# Patient Record
Sex: Female | Born: 1980 | Race: White | Hispanic: No | Smoking: Never smoker
Health system: Southern US, Community
[De-identification: ages and names within clinical notes are randomized; demographics above are authoritative.]

## PROBLEM LIST (undated history)

## (undated) DIAGNOSIS — Z9889 Other specified postprocedural states: Secondary | ICD-10-CM

## (undated) DIAGNOSIS — K8020 Calculus of gallbladder without cholecystitis without obstruction: Secondary | ICD-10-CM

## (undated) DIAGNOSIS — K760 Fatty (change of) liver, not elsewhere classified: Secondary | ICD-10-CM

## (undated) DIAGNOSIS — E669 Obesity, unspecified: Secondary | ICD-10-CM

## (undated) DIAGNOSIS — E282 Polycystic ovarian syndrome: Secondary | ICD-10-CM

## (undated) DIAGNOSIS — K219 Gastro-esophageal reflux disease without esophagitis: Secondary | ICD-10-CM

## (undated) HISTORY — DX: Calculus of gallbladder without cholecystitis without obstruction: K80.20

## (undated) HISTORY — PX: APPENDECTOMY: SHX54

## (undated) HISTORY — PX: COLONOSCOPY: SHX174

## (undated) HISTORY — DX: Gastro-esophageal reflux disease without esophagitis: K21.9

## (undated) HISTORY — PX: KNEE SURGERY: SHX244

## (undated) HISTORY — DX: Obesity, unspecified: E66.9

## (undated) HISTORY — DX: Polycystic ovarian syndrome: E28.2

## (undated) HISTORY — DX: Other specified postprocedural states: Z98.890

## (undated) HISTORY — DX: Fatty (change of) liver, not elsewhere classified: K76.0

## (undated) HISTORY — PX: TONSILLECTOMY: SUR1361

---

## 2009-03-19 ENCOUNTER — Emergency Department (HOSPITAL_COMMUNITY): Admission: EM | Admit: 2009-03-19 | Discharge: 2009-03-19 | Payer: Self-pay | Admitting: Emergency Medicine

## 2010-02-27 ENCOUNTER — Emergency Department (HOSPITAL_COMMUNITY): Admission: EM | Admit: 2010-02-27 | Discharge: 2010-02-27 | Payer: Self-pay | Admitting: Family Medicine

## 2010-05-09 IMAGING — CT CT PELVIS W/ CM
2 of 4 series · 17 of 46 positions shown, 19 images · IV contrast (agent unspecified)
Comparison: None

CT ABDOMEN

CLINICAL DATA: Right side and periumbilical abdominal pain,
nausea, vomiting, diarrhea, negative pregnancy test, status post
appendectomy

CT ABDOMEN AND PELVIS WITH CONTRAST
TECHNIQUE: Multidetector CT imaging of the abdomen and pelvis was
performed using the standard protocol following bolus
administration of intravenous contrast. Breast shield utilized.
Sagittal and coronal MPR images reconstructed from axial data set.
Contrast: Water as oral contrast; 100 ml Cmnipaque-REE IV

[Series 2: abd_pel 5.0 b40f st · axial · 0.73mm/px · z∈[-538,-108]mm · 14 of 94 slices shown, 16 images]
[im 4/94  soft-tissue]
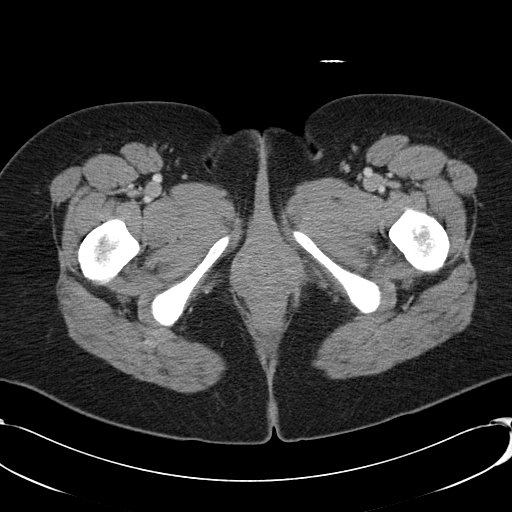
[im 4/94  bone]
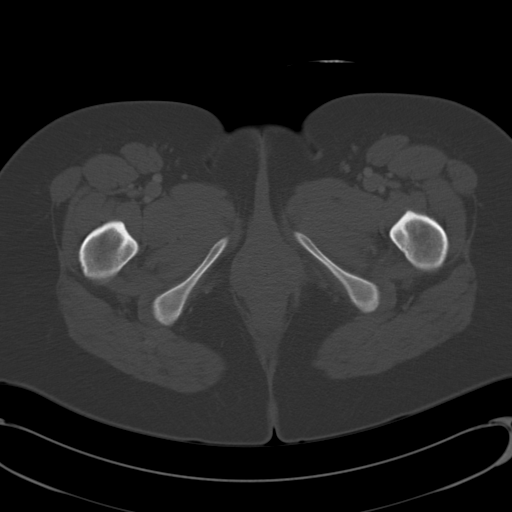
[im 11/94  soft-tissue]
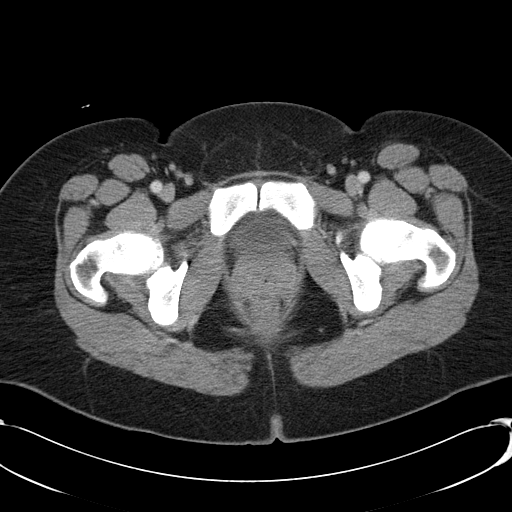
[im 18/94  soft-tissue]
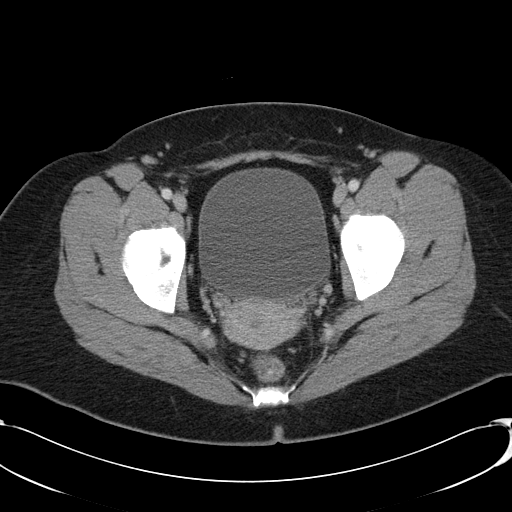
[im 25/94  soft-tissue]
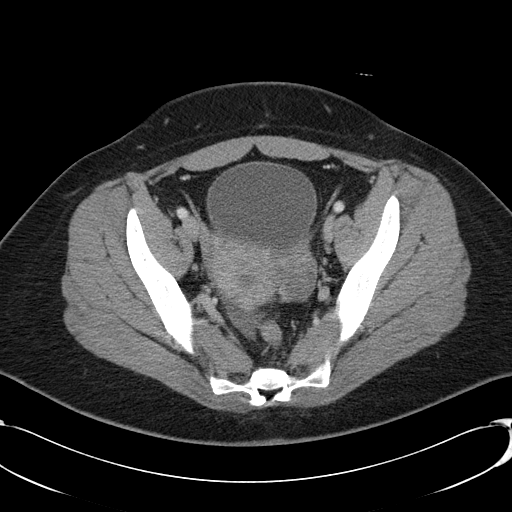
[im 32/94  soft-tissue]
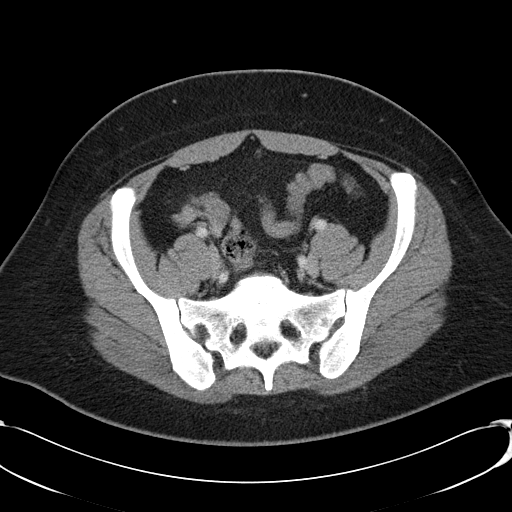
[im 38/94  soft-tissue]
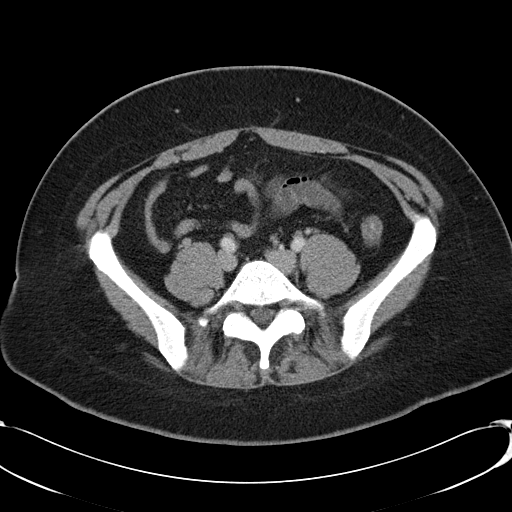
[im 45/94  soft-tissue]
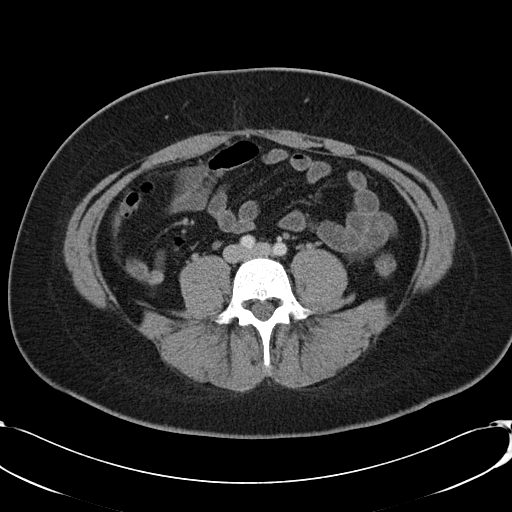
[im 49/94  soft-tissue]
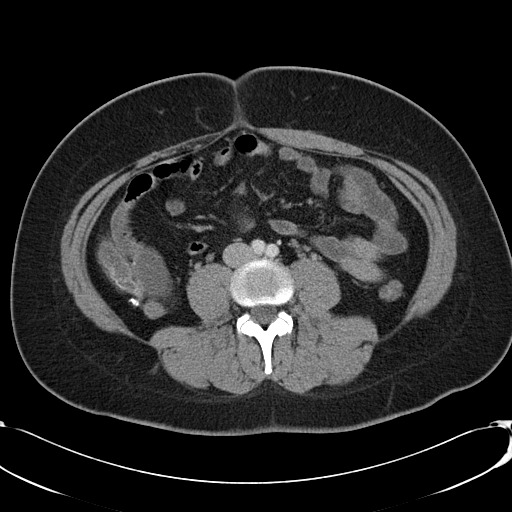
[im 56/94  soft-tissue]
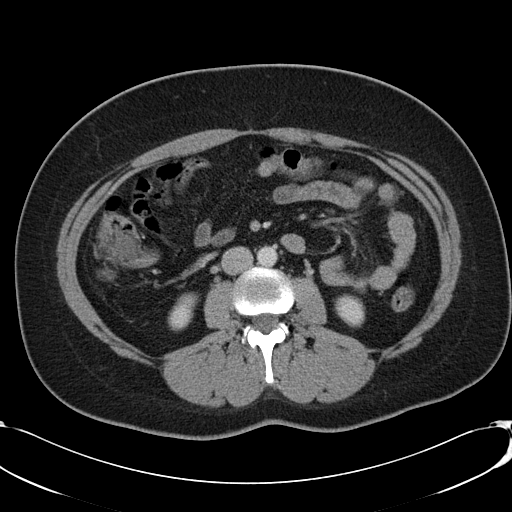
[im 56/94  bone]
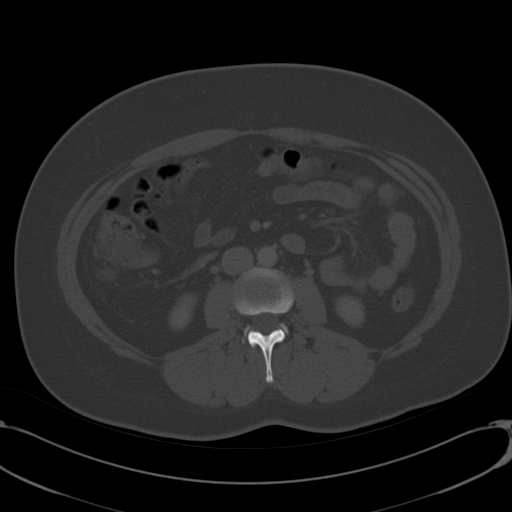
[im 63/94  soft-tissue]
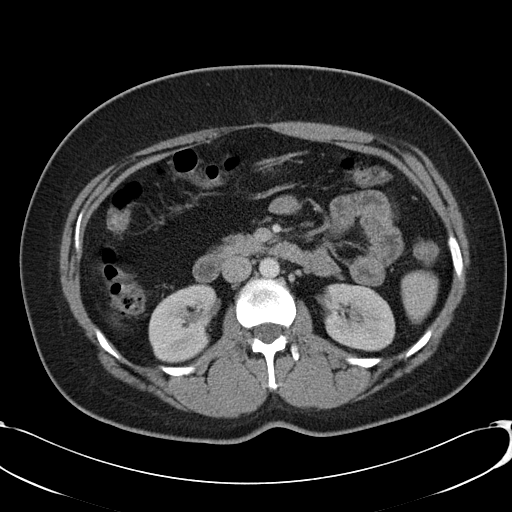
[im 69/94  soft-tissue]
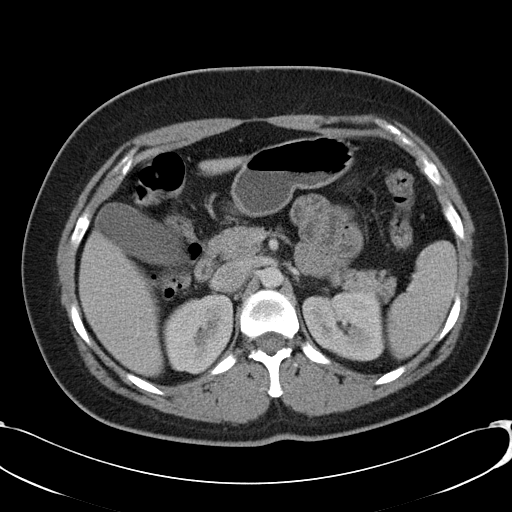
[im 76/94  soft-tissue]
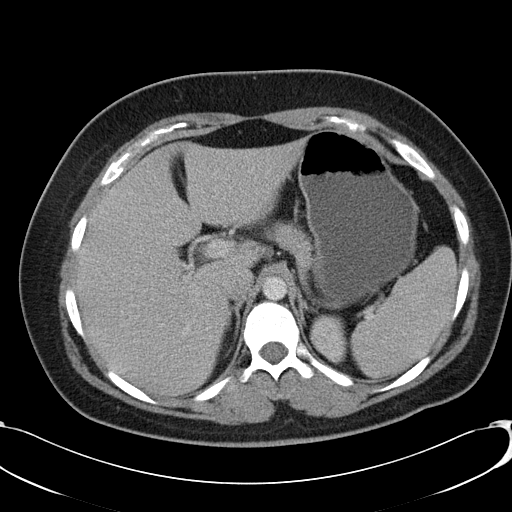
[im 83/94  soft-tissue]
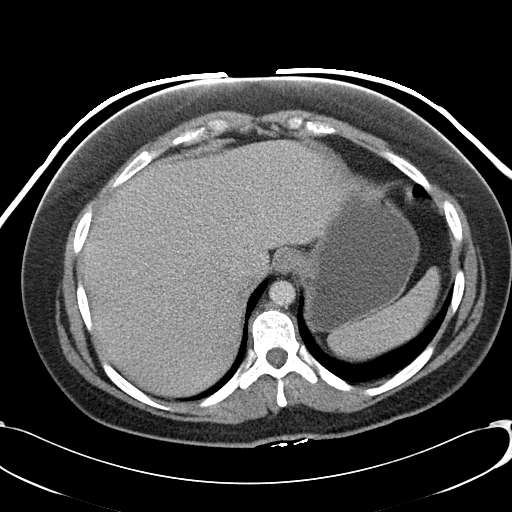
[im 90/94  soft-tissue]
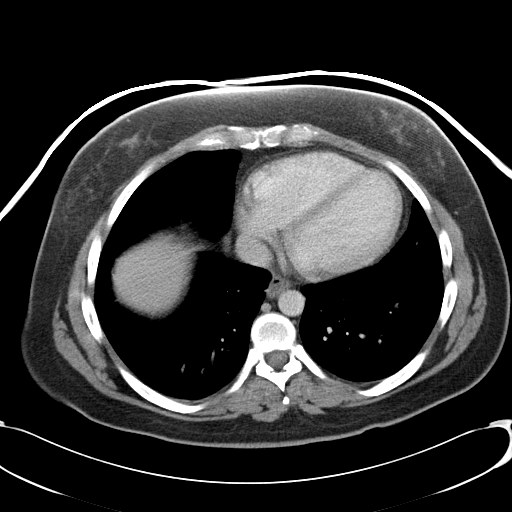

[Series 603: coronal · coronal · 0.95mm/px · 3 of 71 slices shown]
[im 24/71  soft-tissue]
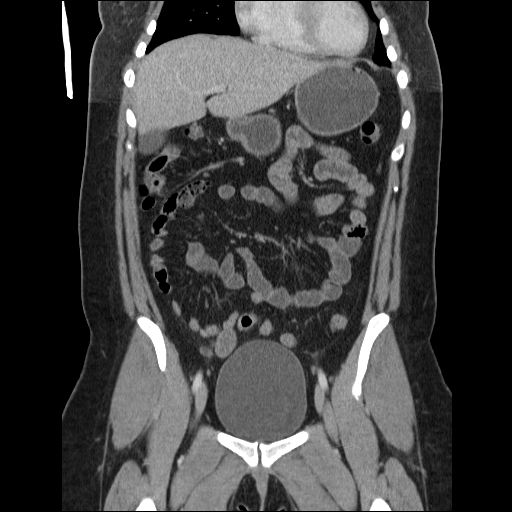
[im 32/71  soft-tissue]
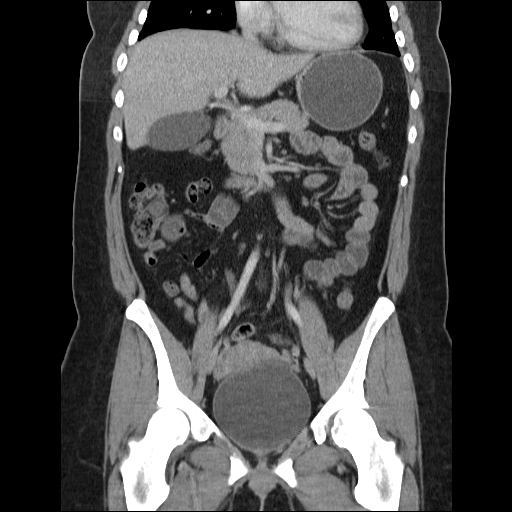
[im 39/71  soft-tissue]
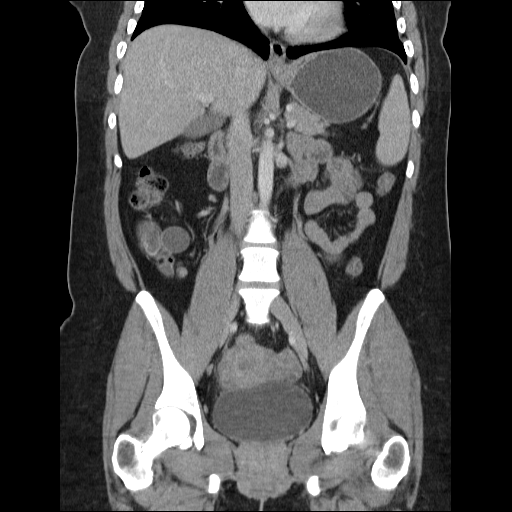

[17 of 46 positions shown; findings below may reference images not displayed]

FINDINGS: Lung bases clear.
Liver, spleen, pancreas, kidneys, and adrenal glands normal.
No upper abdominal mass, adenopathy, free fluid, or inflammatory
process.
Bones unremarkable.
Stomach and upper abdominal bowel loops grossly unremarkable for
exam lacking GI contrast.
IMPRESSION: No acute upper abdominal abnormalities.

CT PELVIS
FINDINGS: Small amount of cul-de-sac fluid.
Bladder and distal ureters normal.
Unremarkable appearance of uterus and ovaries.
Surgical clips present at cecal tip.
No pelvic mass, adenopathy, free fluid, or inflammatory process.
IMPRESSION: Small amount nonspecific free pelvic fluid.
Otherwise negative CT pelvis.

## 2010-08-09 ENCOUNTER — Emergency Department (HOSPITAL_COMMUNITY): Admission: EM | Admit: 2010-08-09 | Discharge: 2010-08-10 | Payer: Self-pay | Admitting: Emergency Medicine

## 2010-08-09 ENCOUNTER — Emergency Department (HOSPITAL_COMMUNITY): Admission: EM | Admit: 2010-08-09 | Discharge: 2010-08-09 | Payer: Self-pay | Admitting: Family Medicine

## 2010-08-22 ENCOUNTER — Ambulatory Visit (HOSPITAL_COMMUNITY): Admission: RE | Admit: 2010-08-22 | Discharge: 2010-08-22 | Payer: Self-pay | Admitting: Family Medicine

## 2010-11-17 ENCOUNTER — Encounter: Payer: Self-pay | Admitting: Family Medicine

## 2010-11-18 ENCOUNTER — Encounter: Payer: Self-pay | Admitting: Family Medicine

## 2011-01-10 LAB — URINALYSIS, ROUTINE W REFLEX MICROSCOPIC
Bilirubin Urine: NEGATIVE
Glucose, UA: NEGATIVE mg/dL
Hgb urine dipstick: NEGATIVE
Specific Gravity, Urine: 1.033 — ABNORMAL HIGH (ref 1.005–1.030)
pH: 5.5 (ref 5.0–8.0)

## 2011-01-10 LAB — DIFFERENTIAL
Basophils Absolute: 0 10*3/uL (ref 0.0–0.1)
Basophils Relative: 0 % (ref 0–1)
Eosinophils Relative: 0 % (ref 0–5)
Lymphocytes Relative: 40 % (ref 12–46)
Monocytes Absolute: 0.7 10*3/uL (ref 0.1–1.0)
Neutro Abs: 6.6 10*3/uL (ref 1.7–7.7)

## 2011-01-10 LAB — COMPREHENSIVE METABOLIC PANEL
AST: 24 U/L (ref 0–37)
Alkaline Phosphatase: 87 U/L (ref 39–117)
BUN: 11 mg/dL (ref 6–23)
CO2: 23 mEq/L (ref 19–32)
Chloride: 107 mEq/L (ref 96–112)
Creatinine, Ser: 0.81 mg/dL (ref 0.4–1.2)
GFR calc non Af Amer: >60 mL/min (ref >60)
Potassium: 3.6 mEq/L (ref 3.5–5.1)
Total Bilirubin: 0.5 mg/dL (ref 0.3–1.2)

## 2011-01-10 LAB — CBC
Hemoglobin: 12.3 g/dL (ref 12.0–15.0)
MCH: 28.9 pg (ref 26.0–34.0)
MCV: 85.9 fL (ref 78.0–100.0)
RBC: 4.25 MIL/uL (ref 3.87–5.11)
WBC: 12.3 10*3/uL — ABNORMAL HIGH (ref 4.0–10.5)

## 2011-01-10 LAB — LIPASE, BLOOD: Lipase: 34 U/L (ref 11–59)

## 2011-02-05 LAB — BASIC METABOLIC PANEL
BUN: 6 mg/dL (ref 6–23)
CO2: 26 mEq/L (ref 19–32)
Calcium: 9.6 mg/dL (ref 8.4–10.5)
Chloride: 102 mEq/L (ref 96–112)
Creatinine, Ser: 0.68 mg/dL (ref 0.4–1.2)
GFR calc Af Amer: >60 mL/min (ref >60)
Glucose, Bld: 97 mg/dL (ref 70–99)

## 2011-02-05 LAB — DIFFERENTIAL
Basophils Absolute: 0.1 10*3/uL (ref 0.0–0.1)
Basophils Relative: 1 % (ref 0–1)
Eosinophils Absolute: 0 10*3/uL (ref 0.0–0.7)
Monocytes Relative: 4 % (ref 3–12)
Neutro Abs: 6.5 10*3/uL (ref 1.7–7.7)
Neutrophils Relative %: 67 % (ref 43–77)

## 2011-02-05 LAB — URINALYSIS, ROUTINE W REFLEX MICROSCOPIC
Bilirubin Urine: NEGATIVE
Glucose, UA: NEGATIVE mg/dL
Hgb urine dipstick: NEGATIVE
Protein, ur: NEGATIVE mg/dL
Urobilinogen, UA: 0.2 mg/dL (ref 0.0–1.0)

## 2011-02-05 LAB — CBC
MCHC: 33.5 g/dL (ref 30.0–36.0)
MCV: 85.8 fL (ref 78.0–100.0)
Platelets: 261 10*3/uL (ref 150–400)
RBC: 4.75 MIL/uL (ref 3.87–5.11)
RDW: 13.6 % (ref 11.5–15.5)

## 2011-02-05 LAB — GC/CHLAMYDIA PROBE AMP, GENITAL: Chlamydia, DNA Probe: NEGATIVE

## 2011-02-05 LAB — WET PREP, GENITAL
Clue Cells Wet Prep HPF POC: NONE SEEN
Trich, Wet Prep: NONE SEEN

## 2011-05-31 ENCOUNTER — Other Ambulatory Visit (HOSPITAL_COMMUNITY)
Admission: RE | Admit: 2011-05-31 | Discharge: 2011-05-31 | Disposition: A | Discharge status: Home / Self Care | Payer: 59 | Source: Ambulatory Visit | Attending: Emergency Medicine | Admitting: Emergency Medicine

## 2011-05-31 DIAGNOSIS — Z01419 Encounter for gynecological examination (general) (routine) without abnormal findings: Secondary | ICD-10-CM | POA: Insufficient documentation

## 2013-08-18 ENCOUNTER — Other Ambulatory Visit: Payer: Self-pay | Admitting: Obstetrics and Gynecology

## 2013-08-18 ENCOUNTER — Other Ambulatory Visit (HOSPITAL_COMMUNITY)
Admission: RE | Admit: 2013-08-18 | Discharge: 2013-08-18 | Disposition: A | Discharge status: Home / Self Care | Payer: 59 | Source: Ambulatory Visit | Attending: Obstetrics and Gynecology | Admitting: Obstetrics and Gynecology

## 2013-08-18 DIAGNOSIS — Z1151 Encounter for screening for human papillomavirus (HPV): Secondary | ICD-10-CM | POA: Insufficient documentation

## 2013-08-18 DIAGNOSIS — Z01419 Encounter for gynecological examination (general) (routine) without abnormal findings: Secondary | ICD-10-CM | POA: Insufficient documentation

## 2013-12-02 ENCOUNTER — Ambulatory Visit: Payer: 59 | Admitting: Family Medicine

## 2014-11-16 ENCOUNTER — Encounter: Payer: Self-pay | Admitting: Dietician

## 2014-11-16 ENCOUNTER — Encounter: Payer: Self-pay | Admitting: Family Medicine

## 2014-11-16 ENCOUNTER — Ambulatory Visit (INDEPENDENT_AMBULATORY_CARE_PROVIDER_SITE_OTHER)
Admission: RE | Admit: 2014-11-16 | Discharge: 2014-11-16 | Disposition: A | Discharge status: Home / Self Care | Payer: 59 | Source: Ambulatory Visit | Attending: Family Medicine | Admitting: Family Medicine

## 2014-11-16 ENCOUNTER — Encounter: Payer: 59 | Attending: Family Medicine | Admitting: Dietician

## 2014-11-16 ENCOUNTER — Ambulatory Visit (INDEPENDENT_AMBULATORY_CARE_PROVIDER_SITE_OTHER): Payer: 59 | Admitting: Family Medicine

## 2014-11-16 VITALS — BP 106/72 | HR 81 | Ht 65.0 in | Wt 244.0 lb

## 2014-11-16 VITALS — Ht 65.0 in | Wt 246.2 lb

## 2014-11-16 DIAGNOSIS — Z713 Dietary counseling and surveillance: Secondary | ICD-10-CM | POA: Diagnosis not present

## 2014-11-16 DIAGNOSIS — M25561 Pain in right knee: Secondary | ICD-10-CM

## 2014-11-16 DIAGNOSIS — Z6841 Body Mass Index (BMI) 40.0 and over, adult: Secondary | ICD-10-CM | POA: Diagnosis not present

## 2014-11-16 DIAGNOSIS — M545 Low back pain, unspecified: Secondary | ICD-10-CM | POA: Insufficient documentation

## 2014-11-16 DIAGNOSIS — M222X1 Patellofemoral disorders, right knee: Secondary | ICD-10-CM

## 2014-11-16 DIAGNOSIS — E669 Obesity, unspecified: Secondary | ICD-10-CM | POA: Insufficient documentation

## 2014-11-16 DIAGNOSIS — M549 Dorsalgia, unspecified: Secondary | ICD-10-CM

## 2014-11-16 DIAGNOSIS — M999 Biomechanical lesion, unspecified: Secondary | ICD-10-CM | POA: Insufficient documentation

## 2014-11-16 DIAGNOSIS — Z79899 Other long term (current) drug therapy: Secondary | ICD-10-CM | POA: Diagnosis not present

## 2014-11-16 DIAGNOSIS — M9903 Segmental and somatic dysfunction of lumbar region: Secondary | ICD-10-CM

## 2014-11-16 DIAGNOSIS — E282 Polycystic ovarian syndrome: Secondary | ICD-10-CM | POA: Diagnosis not present

## 2014-11-16 DIAGNOSIS — M222X9 Patellofemoral disorders, unspecified knee: Secondary | ICD-10-CM | POA: Insufficient documentation

## 2014-11-16 DIAGNOSIS — M9902 Segmental and somatic dysfunction of thoracic region: Secondary | ICD-10-CM

## 2014-11-16 DIAGNOSIS — M9904 Segmental and somatic dysfunction of sacral region: Secondary | ICD-10-CM

## 2014-11-16 NOTE — Assessment & Plan Note (Signed)
Decision today to treat with OMT was based on Physical Exam  After verbal consent patient was treated with HVLA, ME techniques in thoracic, lumbar, sacral areas  Patient tolerated the procedure well with improvement in symptoms  Patient given exercises, stretches and lifestyle modifications  See medications in patient instructions if given  Patient will follow up in 3 weeks

## 2014-11-16 NOTE — Assessment & Plan Note (Signed)
Patellofemoral Syndrome  Reviewed anatomy using anatomical model and how PFS occurs.  Given rehab exercises handout for VMO, hip abductors, core, entire kinetic chain including proprioception exercises including cone touches, step downs, hip elevations and turn outs.  Could benefit from PT, regular exercise, upright biking, and a PFS knee brace to assist with tracking abnormalities. RTC in 3 weeks.

## 2014-11-16 NOTE — Patient Instructions (Signed)
Good to see you Xrays downstairs today.  Ice 20 minutes 2 times daily. Usually after activity and before bed. Vitamin D 2000 IU daily Turmeric 500mg  twice daily Exercises 3 times a week.  Work on Location manager.  See me again in 3 weeks.

## 2014-11-16 NOTE — Progress Notes (Signed)
[facility] [address], [place] [address] Phone: [phone] Subjective:    I'm seeing this patient by the request  of:  WHITE,CYNTHIA S, MD   CC: low back pain  [id number] Katrina Larson is a 34 y.o. female coming in with complaint of low back pain. 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 Patient says that the repetitive activity can give her pain in the thoracic as well as lumbar spine. Patient denies any radiation to any of her extremity. Patient denies any numbness or weakness. States that it is more of a soreness. Feels to be muscular in nature. Does respond somewhat over-the-counter medications. Patient does try to work out regularly but has gained weight over the course of the last several years. Patient rates the severity of 6 out of 10.   Patient also had some mild knee pain on the right side. Patient states that she has a past medical history for surgery and states that there was a ligamentous repair. Patient does not remember exactly well was done. Patient states she does have some popping but this does not cause pain. States that overall it seems to be very mild. Patient is wondering what the popping is which is driving her crazy. No radiation of pain down the leg any numbness or tingling. Not stopping him from any activities. Denies any locking on her.    Past medical history, social, surgical and family history all reviewed in electronic medical record.   Review of Systems: No headache, visual changes, nausea, vomiting, diarrhea, constipation, dizziness, abdominal pain, skin rash, fevers, chills, night sweats, weight loss, swollen lymph nodes, body aches, joint swelling, muscle aches, chest pain, shortness of breath, mood changes.   Objective Blood pressure 106/72, pulse 81, height 5\' 5"  (1.651 m), weight 244 lb (110.678 kg), last menstrual period 11/09/2014, SpO2 99 %.    General: No apparent distress alert and oriented x3 mood and affect normal, dressed appropriately.  HEENT: Pupils equal, extraocular movements intact  Respiratory: Patient's speak in full sentences and does not appear short of breath  Cardiovascular: No lower extremity edema, non tender, no erythema  Skin: Warm dry intact with no signs of infection or rash on extremities or on axial skeleton.  Abdomen: Soft nontender  Neuro: Cranial nerves II through XII are intact, neurovascularly intact in all extremities with 2+ DTRs and 2+ pulses.  Lymph: No lymphadenopathy of posterior or anterior cervical chain or axillae bilaterally.  Gait normal with good balance and coordination.  MSK:  Non tender with full range of motion and good stability and symmetric strength and tone of shoulders, elbows, wrist, hip, and ankles bilaterally.  Knee: Left Normal to inspection with no erythema or effusion or obvious bony abnormalities. Palpation normal with no warmth, joint line tenderness, patellar tenderness, or condyle tenderness. ROM full in flexion and extension and lower leg rotation. Ligaments with solid consistent endpoints including ACL, PCL, LCL, MCL. Negative Mcmurray's, Apley's, and Thessalonian tests. painful patellar compression.  Patellar glide with moderate crepitus and J tracking. Patellar and quadriceps tendons unremarkable. Hamstring and quadriceps strength is normal.    Back Exam:  Inspection: Unremarkable  Motion: Flexion 45 deg, Extension 45 deg, Side Bending to 45 deg bilaterally,  Rotation to 45 deg bilaterally  SLR laying: Negative  XSLR laying: Negative  Palpable tenderness: Moderate tenderness over the paraspinal musculature of the lumbar spine bilaterally but diffusely. FABER: negative. Sensory change:  Gross sensation intact to all lumbar and sacral dermatomes.  Reflexes: 2+ at both patellar tendons, 2+ at achilles tendons, Babinski's downgoing.  Strength at foot   Plantar-flexion: 5/5 Dorsi-flexion: 5/5 Eversion: 5/5 Inversion: 5/5  Leg strength  Quad: 5/5 Hamstring: 5/5 Hip flexor: 5/5 Hip abductors: 4/5  Gait unremarkable.  OMT Physical Exam   Seated Flexion right  Cervical  C2 F RS right  Thoracic T5 E RS left T9 E RS right  Lumbar L2 flexed rotated and side bent left Sacrum Right on right        Impression and Recommendations:     This case required medical decision making of moderate complexity.

## 2014-11-16 NOTE — Progress Notes (Signed)
  Medical Nutrition Therapy:  Appt start time: 0810 end time:  0900.   Assessment:  Primary concerns today: Katrina Larson is here today since she has PCOS. Tries to watch what she eats and works out and feels like it is "flaring" for the past 2 years. Was diagnosed when she was 16. Feels like she might be sensitive to gluten. Feels very frustrated since she is not able to easily lose weight especially lately. She has been gaining weight (about 5 lbs in past 2 weeks). Recently stopped drinking regularly soda about a month ago and was up to 4 per day.   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 Was not sleeping well and started using a essential oil that is really helping. Never feels hungry, makes her herself eat. Metformin makes her nauseas.   Lives with her daughter and Celesta Gentile and she does the food shopping and shares the cooking with her fiancee.    Preferred Learning Style:   No preference indicated   Learning Readiness:   Ready  MEDICATIONS: Metformin 3 x day   DIETARY INTAKE:  Usual eating pattern includes 2-3 meals and 1-2 snacks per day.  Avoided foods include chicken, raw broccoli or cauliflower, onions, celery, most fish  24-hr recall:  B ( AM): eggs, yogurt, or fruit at work, skips at home  Snk ( AM): apple slices, carrots and hummus, or grapes  L ( PM): small amount of gluten free pasta, leftovers Snk ( PM): sometimes might have granola bar or pizza or candy D ( PM): cereal Snk ( PM): sometimes a frozen yogurt bar Beverages: water, milk, hot tea with splenda  Usual physical activity: swimming laps, walk, weight training, cycling, rowing at least 4-5 x week for about 6 months Estimated energy needs: 1800 calories 200 g carbohydrates 135 g protein 50 g fat  Progress Towards Goal(s):  In progress.   Nutritional Diagnosis:  [place]-3.3 Overweight/obesity As related to hx of  PCOS and excess carbohydrate consumption.  As evidenced by BMI of 41.0.    Intervention:  Nutrition counseling provided. Plan: Aim to eat at least 3 x times with protein and carbs. Aim to fill half of your plate with vegetables at 2 meals per day. If you want to have a protein shake for a meal, choose one that is 15 g or more in protein and less than 5 g of carbs and add an additional carbohydrate if you like.  Limit the carbs to a quarter of your or size of the palm of your hand. Or choose one carbohydrate. Continue exercising regularly, try increasing intensity. Keep refined carbs (sweets, white flour) to a minimum. Check out this website for research and supplement information: Http://www.pcosnutrition.com  Teaching Method Utilized:  Visual Auditory Hands on  Handouts given during visit include:  MyPlate Handout  75I CHO Snacks  Yellow Card  Mediterranean Diet  Protein Shakes  Barriers to learning/adherence to lifestyle change: not feeling very hungry  Demonstrated degree of understanding via:  Teach Back   Monitoring/Evaluation:  Dietary intake, exercise, and body weight in 2 month(s).

## 2014-11-16 NOTE — Patient Instructions (Addendum)
Aim to eat at least 3 x times with protein and carbs. Aim to fill half of your plate with vegetables at 2 meals per day. If you want to have a protein shake for a meal, choose one that is 15 g or more in protein and less than 5 g of carbs and add an additional carbohydrate if you like.  Limit the carbs to a quarter of your or size of the palm of your hand. Or choose one carbohydrate. Continue exercising regularly, try increasing intensity. Keep refined carbs (sweets, white flour) to a minimum. Check out this website for research and supplement information: Http://www.pcosnutrition.com

## 2014-11-16 NOTE — Assessment & Plan Note (Signed)
Patient does have low back pain that this is multifactorial due to secondary of poor core strength as well as recent weight gain. Patient has been active but not as active as she should be. Patient was given more of an exercise prescription discuss core strengthening exercises. Patient was given a handout of home exercises and icing protocol. Patient had a make these changes and come back and see me again in 3 weeks. Patient did respond very well to osteopathic manipulation. X-rays ordered today.

## 2014-12-07 ENCOUNTER — Ambulatory Visit (INDEPENDENT_AMBULATORY_CARE_PROVIDER_SITE_OTHER): Payer: 59 | Admitting: Family Medicine

## 2014-12-07 ENCOUNTER — Encounter: Payer: Self-pay | Admitting: Family Medicine

## 2014-12-07 VITALS — BP 112/82 | HR 82 | Ht 65.0 in | Wt 247.0 lb

## 2014-12-07 DIAGNOSIS — M545 Low back pain, unspecified: Secondary | ICD-10-CM

## 2014-12-07 DIAGNOSIS — M999 Biomechanical lesion, unspecified: Secondary | ICD-10-CM

## 2014-12-07 DIAGNOSIS — M9902 Segmental and somatic dysfunction of thoracic region: Secondary | ICD-10-CM

## 2014-12-07 DIAGNOSIS — M9903 Segmental and somatic dysfunction of lumbar region: Secondary | ICD-10-CM

## 2014-12-07 DIAGNOSIS — M9904 Segmental and somatic dysfunction of sacral region: Secondary | ICD-10-CM

## 2014-12-07 DIAGNOSIS — M2141 Flat foot [pes planus] (acquired), right foot: Secondary | ICD-10-CM | POA: Insufficient documentation

## 2014-12-07 DIAGNOSIS — M2142 Flat foot [pes planus] (acquired), left foot: Secondary | ICD-10-CM

## 2014-12-07 MED ORDER — IBUPROFEN-FAMOTIDINE 800-26.6 MG PO TABS: 1.0000 | ORAL_TABLET | Freq: Three times a day (TID) | ORAL | Status: DC

## 2014-12-07 NOTE — Assessment & Plan Note (Signed)
Decision today to treat with OMT was based on Physical Exam  After verbal consent patient was treated with HVLA, ME techniques in thoracic, lumbar, sacral areas  Patient tolerated the procedure well with improvement in symptoms  Patient given exercises, stretches and lifestyle modifications  See medications in patient instructions if given  Patient will follow up in 3 weeks

## 2014-12-07 NOTE — Patient Instructions (Signed)
Good to see you We will get you set up for orthotics Continue with the exercises We may consider a different knee brace  Ice is your friend I want to se eyou again in 3-4 weeks.

## 2014-12-07 NOTE — Progress Notes (Signed)
Pre visit review using our clinic review tool, if applicable. No additional management support is needed unless otherwise documented below in the visit note. 

## 2014-12-07 NOTE — Assessment & Plan Note (Signed)
Patient low back pain is still multifactorial. Patient encouraged to continue the home exercises. Patient was given a new medication with ibuprofen as well as foment to being to try to protect her stomach. We discussed a Pharmacist, community. Patient given other home exercises. Patient will come back and see me again in 3-4 weeks for further evaluation and treatment.

## 2014-12-07 NOTE — Progress Notes (Signed)
[facility] [address] [place] [address] [place], [place] [address] Phone: [phone] Subjective:    I'm seeing this patient by the request  of:  WHITE,CYNTHIA S, MD   CC: low back pain  BJY:[id number] JANDA Katrina Larson is a 34 y.o. female coming in with complaint of low back pain. Patient was seen previously and most of her back pain seemed and multifactorial. Patient was given home exercises and states that she was doing relatively better for some time. Patient did respond well to the osteopathic manipulation states that after about 7-10 days the pain started coming back slowly. Patient is trying to do the exercises regularly but does have some difficulty. Patient has been taking some ibuprofen but is trying to avoid it because it could potentially her stomach but has never had pain previously.   Patient is also complaining of right knee pain. Patient was seen previously one did have more of a patellofemoral pain syndrome. Patient states that this is helping somewhat as well. Has not been bracing in. No numbness or tingling. Not stopping him from any activities but after standing for a long amount of time during the day and give her some trouble.    Past medical history, social, surgical and family history all reviewed in electronic medical record.   Review of Systems: No headache, visual changes, nausea, vomiting, diarrhea, constipation, dizziness, abdominal pain, skin rash, fevers, chills, night sweats, weight loss, swollen lymph nodes, body aches, joint swelling, muscle aches, chest pain, shortness of breath, mood changes.   Objective Blood pressure 112/82, pulse 82, height 5\' 5"  (1.651 m), weight 247 lb (112.038 kg), last menstrual period 11/09/2014, SpO2 98 %.  General: No apparent distress alert and oriented x3 mood and affect normal, dressed appropriately.  HEENT: Pupils equal, extraocular movements intact  Respiratory: Patient's speak in full sentences and does  not appear short of breath  Cardiovascular: No lower extremity edema, non tender, no erythema  Skin: Warm dry intact with no signs of infection or rash on extremities or on axial skeleton.  Abdomen: Soft nontender  Neuro: Cranial nerves II through XII are intact, neurovascularly intact in all extremities with 2+ DTRs and 2+ pulses.  Lymph: No lymphadenopathy of posterior or anterior cervical chain or axillae bilaterally.  Gait normal with good balance and coordination.  MSK:  Non tender with full range of motion and good stability and symmetric strength and tone of shoulders, elbows, wrist, hip, and ankles bilaterally.  Knee: Left Normal to inspection with no erythema or effusion or obvious bony abnormalities. Mild discomfort over the superior lateral patella ROM full in flexion and extension and lower leg rotation. Ligaments with solid consistent endpoints including ACL, PCL, LCL, MCL. Negative Mcmurray's, Apley's, and Thessalonian tests. painful patellar compression.  Patellar glide with moderate crepitus and J tracking. Patellar and quadriceps tendons unremarkable. Hamstring and quadriceps strength is normal.  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 Patient also has shortened mid and forefoot.  Back Exam:  Inspection: Unremarkable  Motion: Flexion 45 deg, Extension 45 deg, Side Bending to 45 deg bilaterally,  Rotation to 45 deg bilaterally  SLR laying: Negative  XSLR laying: Negative  Palpable tenderness: Decreased tenderness severely FABER: negative. Sensory change: Gross sensation intact to all lumbar and sacral dermatomes.  Reflexes: 2+ at both patellar tendons, 2+ at achilles tendons, Babinski's downgoing.  Strength at foot  Plantar-flexion: 5/5 Dorsi-flexion: 5/5 Eversion: 5/5  Inversion: 5/5  Leg strength  Quad: 5/5 Hamstring: 5/5 Hip flexor: 5/5 Hip abductors: 4/5  Gait  unremarkable.  OMT Physical Exam  Seated Flexion right  Cervical  C2 F RS right  Thoracic T5 E RS left T9 E RS right  Lumbar L2 flexed rotated and side bent left Sacrum Right on right        Impression and Recommendations:     This case required medical decision making of moderate complexity.

## 2014-12-07 NOTE — Assessment & Plan Note (Signed)
Discussed with patient at this time that I do think that some of her foot breakdown could also be contribute in. Patient was given the choice is a different over-the-counter shoes or over-the-counter orthotics. Patient likely will need custom orthotics we may need to consider getting patient signed up. Patient will come back and see me again in 3 weeks.

## 2014-12-16 ENCOUNTER — Encounter: Payer: Self-pay | Admitting: Family Medicine

## 2014-12-16 ENCOUNTER — Ambulatory Visit (INDEPENDENT_AMBULATORY_CARE_PROVIDER_SITE_OTHER): Payer: 59 | Admitting: Family Medicine

## 2014-12-16 DIAGNOSIS — M2141 Flat foot [pes planus] (acquired), right foot: Secondary | ICD-10-CM

## 2014-12-16 DIAGNOSIS — M2142 Flat foot [pes planus] (acquired), left foot: Secondary | ICD-10-CM

## 2014-12-16 NOTE — Progress Notes (Signed)
PROCEDURE VISIT Procedure Note   Patient was fitted for a : standard, cushioned, semi-rigid orthotic. The orthotic was heated and afterward the patient patient seated position and molded The patient was positioned in subtalar neutral position and 10 degrees of ankle dorsiflexion in a weight bearing stance. After completion of molding, patient did have orthotic management The blank was ground to a stable position for weight bearing. Size:7.5 Base: Carbon fiber Additional Posting and Padding: Medial calcaneal post as well as longitudinal post with metatarsal pad The patient ambulated these, and they were very comfortable.

## 2014-12-16 NOTE — Assessment & Plan Note (Signed)
Patient was placing custom orthotics today. Patient went over with the of flexion today the proper management and use as well as slowly increasing the duration of wear over the course the next 2 weeks. We discussed continuing the home exercises, icing protocol, and continuing the medications that have been prescribed. Patient come back and see me again in 2-4 weeks for further evaluation and treatment.

## 2014-12-16 NOTE — Patient Instructions (Signed)
Verbal instructions given

## 2014-12-28 ENCOUNTER — Encounter: Payer: Self-pay | Admitting: Family Medicine

## 2014-12-28 ENCOUNTER — Ambulatory Visit (INDEPENDENT_AMBULATORY_CARE_PROVIDER_SITE_OTHER): Payer: 59 | Admitting: Family Medicine

## 2014-12-28 VITALS — BP 114/82 | HR 84 | Ht 65.0 in | Wt 248.0 lb

## 2014-12-28 DIAGNOSIS — M9903 Segmental and somatic dysfunction of lumbar region: Secondary | ICD-10-CM

## 2014-12-28 DIAGNOSIS — M545 Low back pain, unspecified: Secondary | ICD-10-CM

## 2014-12-28 DIAGNOSIS — M9904 Segmental and somatic dysfunction of sacral region: Secondary | ICD-10-CM

## 2014-12-28 DIAGNOSIS — M9902 Segmental and somatic dysfunction of thoracic region: Secondary | ICD-10-CM

## 2014-12-28 DIAGNOSIS — M999 Biomechanical lesion, unspecified: Secondary | ICD-10-CM

## 2014-12-28 NOTE — Progress Notes (Signed)
Pre visit review using our clinic review tool, if applicable. No additional management support is needed unless otherwise documented below in the visit note. 

## 2014-12-28 NOTE — Progress Notes (Signed)
  [facility] [address], [place] [address] Phone: [phone] Subjective:     CC: low back pain follow up  Katrina Larson is a 34 y.o. female coming in with complaint of low back pain.  Patient has been treated for more of muscle skeletal pathology. Patient has been doing the home exercises fairly regularly and states that she has noticed some mild increase in days when she does not have as severe pain. Patient states that the orthotic she was given has helped significantly as well.  Patient states that her knee is improving as well. States that it only hurts minorly throughout the day. No radiation of pain. Not stopping her from any daily activities.    Past medical history, social, surgical and family history all reviewed in electronic medical record.   Review of Systems: No headache, visual changes, nausea, vomiting, diarrhea, constipation, dizziness, abdominal pain, skin rash, fevers, chills, night sweats, weight loss, swollen lymph nodes, body aches, joint swelling, muscle aches, chest pain, shortness of breath, mood changes.   Objective Blood pressure 114/82, pulse 84, height 5\' 5"  (1.651 m), weight 248 lb (112.492 kg), SpO2 99 %.  General: No apparent distress alert and oriented x3 mood and affect normal, dressed appropriately.  HEENT: Pupils equal, extraocular movements intact  Respiratory: Patient's speak in full sentences and does not appear short of breath  Cardiovascular: No lower extremity edema, non tender, no erythema  Skin: Warm dry intact with no signs of infection or rash on extremities or on axial skeleton.  Abdomen: Soft nontender  Neuro: Cranial nerves II through XII are intact, neurovascularly intact in all extremities with 2+ DTRs and 2+ pulses.  Lymph: No lymphadenopathy of posterior or anterior cervical chain or axillae bilaterally.  Gait normal with good balance and coordination.  MSK:  Non tender with  full range of motion and good stability and symmetric strength and tone of shoulders, elbows, wrist, hip, and ankles bilaterally.  Knee: Left Normal to inspection with no erythema or effusion or obvious bony abnormalities. Mild discomfort over the superior lateral patella ROM full in flexion and extension and lower leg rotation. Ligaments with solid consistent endpoints including ACL, PCL, LCL, MCL. Negative Mcmurray's, Apley's, and Thessalonian tests. painful patellar compression.  Patellar glide with moderate crepitus and J tracking. Patellar and quadriceps tendons unremarkable. Hamstring and quadriceps strength is normal.  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 Patient also has shortened mid and forefoot.  Back Exam:  Inspection: Unremarkable  Motion: Flexion 35 deg, Extension 35 deg, Side Bending to 45 deg bilaterally,  Rotation to 45 deg bilaterally  SLR laying: Negative  XSLR laying: Negative  Palpable tenderness: Minimal tenderness FABER: negative. Sensory change: Gross sensation intact to all lumbar and sacral dermatomes.  Reflexes: 2+ at both patellar tendons, 2+ at achilles tendons, Babinski's downgoing.  Strength at foot  Plantar-flexion: 5/5 Dorsi-flexion: 5/5 Eversion: 5/5 Inversion: 5/5  Leg strength  Quad: 5/5 Hamstring: 5/5 Hip flexor: 5/5 Hip abductors: 4/5  Gait unremarkable.  OMT Physical Exam  Seated Flexion right  Cervical  C2 F RS right  Thoracic T5 E RS left T9 E RS right  Lumbar L2 flexed rotated and side bent left Sacrum Right on right      Impression and Recommendations:     This case required medical decision making of moderate complexity.

## 2014-12-28 NOTE — Assessment & Plan Note (Signed)
Decision today to treat with OMT was based on Physical Exam  After verbal consent patient was treated with HVLA, ME techniques in thoracic, lumbar, sacral areas  Patient tolerated the procedure well with improvement in symptoms  Patient given exercises, stretches and lifestyle modifications  See medications in patient instructions if given  Patient will follow up in 4 weeks

## 2014-12-28 NOTE — Assessment & Plan Note (Signed)
Patient's low back pain seems to be multifactorial secondary to mostly [place] skeletal complains. We discussed icing protocol and patient given to mother phase II strengthening exercises. Patient is taking the anti-inflammatories on an as-needed basis and continuing the natural supplementation. Patient will come back and see me again in 4 weeks for further evaluation and treatment.

## 2014-12-28 NOTE — Patient Instructions (Signed)
Good to see you Keep it up!!!! Ice is your friend Love the idea of yoga.  Continue the vitamins See me again in 4 weeks.

## 2015-01-18 ENCOUNTER — Encounter: Payer: 59 | Attending: Family Medicine | Admitting: Dietician

## 2015-01-18 VITALS — Ht 65.0 in | Wt 247.2 lb

## 2015-01-18 DIAGNOSIS — Z713 Dietary counseling and surveillance: Secondary | ICD-10-CM | POA: Insufficient documentation

## 2015-01-18 DIAGNOSIS — Z79899 Other long term (current) drug therapy: Secondary | ICD-10-CM | POA: Insufficient documentation

## 2015-01-18 DIAGNOSIS — E669 Obesity, unspecified: Secondary | ICD-10-CM | POA: Insufficient documentation

## 2015-01-18 DIAGNOSIS — E282 Polycystic ovarian syndrome: Secondary | ICD-10-CM | POA: Insufficient documentation

## 2015-01-18 DIAGNOSIS — Z6841 Body Mass Index (BMI) 40.0 and over, adult: Secondary | ICD-10-CM | POA: Insufficient documentation

## 2015-01-18 NOTE — Progress Notes (Signed)
  Medical Nutrition Therapy:  Appt start time: 0800 end time:  0820.  Assessment:  Primary concerns today: Shaylie is here today for a follow up for PCOS and obesity. Still feeling nauseas when she eats on the metformin. Seeing a sports doctor to help with feet and knees which is helping to increase her exercise. Gained 1 lb since last time. Not missing breakfast anymore. Losing some inches. Drinking more fluid than before.    Preferred Learning Style:   No preference indicated   Learning Readiness:   Ready  MEDICATIONS: Metformin 3 x day   DIETARY INTAKE:  Usual eating pattern includes 2-3 meals and 1-2 snacks per day.  Avoided foods include chicken, raw broccoli or cauliflower, onions, celery, most fish  24-hr recall:  B ( AM): Premier protein shake or egg Snk ( AM): fruit or yogurt L ( PM): small amount of gluten free pasta, leftovers or skips if she is hungry Snk ( PM): sometimes might have granola bar or pizza D ( PM): cereal Snk ( PM): sometimes a frozen yogurt bar Beverages: water, milk, hot tea with splenda  Usual physical activity: swimming laps, yoga, drums alive, walk, weight training, cycling, rowing at least 4-5 x week for about 6 months Estimated energy needs: 1800 calories 200 g carbohydrates 135 g protein 50 g fat  Progress Towards Goal(s):  In progress.   Nutritional Diagnosis:  [place]-3.3 Overweight/obesity As related to hx of PCOS and excess carbohydrate consumption.  As evidenced by BMI of 41.0.    Intervention:  Nutrition counseling provided. Plan: Aim to eat at least 3 x times with protein and carbs. Aim to fill half of your plate with vegetables at 2 meals per day. Limit the carbs to a quarter of your or size of the palm of your hand. Or choose one carbohydrate. Continue exercising regularly, try increasing intensity. Keep refined carbs (sweets, white flour) to a minimum. Try ginger tea (or ginger/pear) to help with nausea. com  Teaching Method  Utilized:  Visual Auditory Hands on   Barriers to learning/adherence to lifestyle change: not feeling very hungry  Demonstrated degree of understanding via:  Teach Back   Monitoring/Evaluation:  Dietary intake, exercise, and body weight in 2 month(s).

## 2015-01-18 NOTE — Patient Instructions (Addendum)
Aim to eat at least 3 x times with protein and carbs. Aim to fill half of your plate with vegetables at 2 meals per day. Limit the carbs to a quarter of your or size of the palm of your hand. Or choose one carbohydrate. Continue exercising regularly, try increasing intensity. Keep refined carbs (sweets, white flour) to a minimum. Try ginger tea (or ginger/pear) to help with nausea.

## 2015-01-30 ENCOUNTER — Encounter: Payer: Self-pay | Admitting: Family Medicine

## 2015-01-30 ENCOUNTER — Ambulatory Visit (INDEPENDENT_AMBULATORY_CARE_PROVIDER_SITE_OTHER): Payer: 59 | Admitting: Family Medicine

## 2015-01-30 VITALS — BP 124/86 | HR 88 | Ht 65.0 in | Wt 250.0 lb

## 2015-01-30 DIAGNOSIS — M9903 Segmental and somatic dysfunction of lumbar region: Secondary | ICD-10-CM

## 2015-01-30 DIAGNOSIS — M7150 Other bursitis, not elsewhere classified, unspecified site: Secondary | ICD-10-CM | POA: Diagnosis not present

## 2015-01-30 DIAGNOSIS — M9902 Segmental and somatic dysfunction of thoracic region: Secondary | ICD-10-CM | POA: Diagnosis not present

## 2015-01-30 DIAGNOSIS — M545 Low back pain, unspecified: Secondary | ICD-10-CM

## 2015-01-30 DIAGNOSIS — M9904 Segmental and somatic dysfunction of sacral region: Secondary | ICD-10-CM | POA: Diagnosis not present

## 2015-01-30 DIAGNOSIS — M2142 Flat foot [pes planus] (acquired), left foot: Secondary | ICD-10-CM

## 2015-01-30 DIAGNOSIS — M2141 Flat foot [pes planus] (acquired), right foot: Secondary | ICD-10-CM | POA: Diagnosis not present

## 2015-01-30 DIAGNOSIS — M7050 Other bursitis of knee, unspecified knee: Secondary | ICD-10-CM | POA: Insufficient documentation

## 2015-01-30 DIAGNOSIS — M999 Biomechanical lesion, unspecified: Secondary | ICD-10-CM

## 2015-01-30 NOTE — Assessment & Plan Note (Signed)
Patient was given home exercises and icing pedicle. We discussed compression and senna patient's bracing. We will see if patient makes a difference. If patient continues to have pain injection may be necessary.

## 2015-01-30 NOTE — Assessment & Plan Note (Signed)
Overall improving with conservative management. We did discuss core strengthening as well as weight loss and we do think would make a significant improvement. We discussed obtaining the icing protocol as well as topical anti-inflammatories as well as the over-the-counter natural supplementation. Patient will continue to wear the new orthotics which seems to be helping as well. Patient and will come back and see me again in 4-6 weeks for further evaluation and treatment.

## 2015-01-30 NOTE — Progress Notes (Signed)
  [facility] [address], [place] [address] Phone: [phone] Subjective:     CC: low back pain follow up  [id number] Katrina Larson is a 34 y.o. female coming in with complaint of low back pain.  Patient was found to have more muscle imbalances as well as malalignment from patient's flatfeet. Patient was given home exercises, icing protocol, over-the-counter natural vitamins supplementations as well as patient was put in new orthotics. Patient states overall she has been doing fairly well. Starting have a little bit of knee pain. More on the medial aspect of the lateral aspect. Denies any numbness or Tingling.  Still able to do daily activities.      Past medical history, social, surgical and family history all reviewed in electronic medical record.   Review of Systems: No headache, visual changes, nausea, vomiting, diarrhea, constipation, dizziness, abdominal pain, skin rash, fevers, chills, night sweats, weight loss, swollen lymph nodes, body aches, joint swelling, muscle aches, chest pain, shortness of breath, mood changes.   Objective Blood pressure 124/86, pulse 88, height 5\' 5"  (1.651 m), weight 250 lb (113.399 kg), SpO2 98 %.  General: No apparent distress alert and oriented x3 mood and affect normal, dressed appropriately.  HEENT: Pupils equal, extraocular movements intact  Respiratory: Patient's speak in full sentences and does not appear short of breath  Cardiovascular: No lower extremity edema, non tender, no erythema  Skin: Warm dry intact with no signs of infection or rash on extremities or on axial skeleton.  Abdomen: Soft nontender  Neuro: Cranial nerves II through XII are intact, neurovascularly intact in all extremities with 2+ DTRs and 2+ pulses.  Lymph: No lymphadenopathy of posterior or anterior cervical chain or axillae bilaterally.  Gait normal with good balance and coordination.  MSK:  Non tender with full range of  motion and good stability and symmetric strength and tone of shoulders, elbows, wrist, hip, and ankles bilaterally.   Patient's right knee exam shows the patient has some pain over the pes anserine bursa sac. No numbness. Patient has some tight hamstrings bilaterally record of the left. Patient's knee though has a negative McMurray's. Minimally tender over the patellofemoral joint still.   Back Exam:  Inspection: Unremarkable  Motion: Flexion 35 deg, Extension 35 deg, Side Bending to 45 deg bilaterally,  Rotation to 45 deg bilaterally  SLR laying: Negative  XSLR laying: Negative  Palpable tenderness: Minimal tenderness of the piriformis bilaterally FABER: negative. Sensory change: Gross sensation intact to all lumbar and sacral dermatomes.  Reflexes: 2+ at both patellar tendons, 2+ at achilles tendons, Babinski's downgoing.  Strength at foot  Plantar-flexion: 5/5 Dorsi-flexion: 5/5 Eversion: 5/5 Inversion: 5/5  Leg strength  Quad: 5/5 Hamstring: 5/5 Hip flexor: 5/5 Hip abductors: 4/5  Gait unremarkable.  OMT Physical Exam  Cervical  C2 F RS right  Thoracic T2 extended rotated and side bent left with inhaled second rib T5 E RS left T9 E RS right  Lumbar L2 flexed rotated and side bent left Sacrum Right on right      Impression and Recommendations:     This case required medical decision making of moderate complexity.

## 2015-01-30 NOTE — Progress Notes (Signed)
Pre visit review using our clinic review tool, if applicable. No additional management support is needed unless otherwise documented below in the visit note. 

## 2015-01-30 NOTE — Patient Instructions (Addendum)
Good to see you as always.  Ice is always your friend We will get the orthotics fixed today.  Continue the exercises and try to do hamstring ones 3 times a week. We can always do injection if needed Continue the vitamins Consider compression instead of brace.  See me again in 4-5 weeks.

## 2015-01-30 NOTE — Assessment & Plan Note (Signed)
Decision today to treat with OMT was based on Physical Exam  After verbal consent patient was treated with HVLA, ME techniques in thoracic, lumbar, sacral areas  Patient tolerated the procedure well with improvement in symptoms  Patient given exercises, stretches and lifestyle modifications  See medications in patient instructions if given  Patient will follow up in 4-6 weeks

## 2015-01-30 NOTE — Assessment & Plan Note (Signed)
Corrections to orthotics made today.

## 2015-02-09 ENCOUNTER — Ambulatory Visit (INDEPENDENT_AMBULATORY_CARE_PROVIDER_SITE_OTHER): Payer: 59 | Admitting: Internal Medicine

## 2015-02-09 ENCOUNTER — Other Ambulatory Visit (INDEPENDENT_AMBULATORY_CARE_PROVIDER_SITE_OTHER): Payer: 59

## 2015-02-09 ENCOUNTER — Encounter: Payer: Self-pay | Admitting: Internal Medicine

## 2015-02-09 VITALS — BP 116/80 | HR 75 | Temp 98.0°F | Resp 16 | Ht 65.0 in | Wt 253.0 lb

## 2015-02-09 DIAGNOSIS — E282 Polycystic ovarian syndrome: Secondary | ICD-10-CM

## 2015-02-09 LAB — COMPREHENSIVE METABOLIC PANEL
ALT: 17 U/L (ref 0–35)
AST: 16 U/L (ref 0–37)
Albumin: 4.1 g/dL (ref 3.5–5.2)
Alkaline Phosphatase: 109 U/L (ref 39–117)
BUN: 11 mg/dL (ref 6–23)
CO2: 27 mEq/L (ref 19–32)
CREATININE: 0.79 mg/dL (ref 0.40–1.20)
Calcium: 9.4 mg/dL (ref 8.4–10.5)
Chloride: 105 mEq/L (ref 96–112)
GFR: 88.36 mL/min (ref >60.00)
Glucose, Bld: 94 mg/dL (ref 70–99)
Potassium: 4.1 mEq/L (ref 3.5–5.1)
Sodium: 138 mEq/L (ref 135–145)
Total Bilirubin: 0.4 mg/dL (ref 0.2–1.2)
Total Protein: 7.8 g/dL (ref 6.0–8.3)

## 2015-02-09 LAB — HEMOGLOBIN A1C: Hgb A1c MFr Bld: 6 % (ref 4.6–6.5)

## 2015-02-09 LAB — LIPID PANEL
Cholesterol: 184 mg/dL (ref 0–200)
HDL: 40.1 mg/dL (ref >39.00)
LDL Cholesterol: 119 mg/dL — ABNORMAL HIGH (ref 0–99)
NonHDL: 143.9
TRIGLYCERIDES: 127 mg/dL (ref 0.0–149.0)
Total CHOL/HDL Ratio: 5
VLDL: 25.4 mg/dL (ref 0.0–40.0)

## 2015-02-09 MED ORDER — NORGESTIMATE-ETH ESTRADIOL 0.25-35 MG-MCG PO TABS: 1.0000 | ORAL_TABLET | Freq: Every day | ORAL | Status: DC

## 2015-02-09 NOTE — Progress Notes (Signed)
Pre visit review using our clinic review tool, if applicable. No additional management support is needed unless otherwise documented below in the visit note. 

## 2015-02-09 NOTE — Patient Instructions (Signed)
Reduce the metformin to once a day. Call us back in 1 month and if you are doing well we can increase it to 1000 mg extended release that you will take once a day as well.   We have sent in birth control for you to help with some of the symptoms. If you have any problems with it please call the office.   We will see you back in about 6 months to see how you are doing.   Keep working on the health diet and exercising as you are able. Exercise to lose weight is generally 45 minutes a day for 6 days a week.   Exercise to Lose Weight Exercise and a healthy diet may help you lose weight. Your doctor may suggest specific exercises. EXERCISE IDEAS AND TIPS  Choose low-cost things you enjoy doing, such as walking, bicycling, or exercising to workout videos.  Take stairs instead of the elevator.  Walk during your lunch break.  Park your car further away from work or school.  Go to a gym or an exercise class.  Start with 5 to 10 minutes of exercise each day. Build up to 30 minutes of exercise 4 to 6 days a week.  Wear shoes with good support and comfortable clothes.  Stretch before and after working out.  Work out until you breathe harder and your heart beats faster.  Drink extra water when you exercise.  Do not do so much that you hurt yourself, feel dizzy, or get very short of breath. Exercises that burn about 150 calories:  Running 1  miles in 15 minutes.  Playing volleyball for 45 to 60 minutes.  Washing and waxing a car for 45 to 60 minutes.  Playing touch football for 45 minutes.  Walking 1  miles in 35 minutes.  Pushing a stroller 1  miles in 30 minutes.  Playing basketball for 30 minutes.  Raking leaves for 30 minutes.  Bicycling 5 miles in 30 minutes.  Walking 2 miles in 30 minutes.  Dancing for 30 minutes.  Shoveling snow for 15 minutes.  Swimming laps for 20 minutes.  Walking up stairs for 15 minutes.  Bicycling 4 miles in 15 minutes.  Gardening  for 30 to 45 minutes.  Jumping rope for 15 minutes.  Washing windows or floors for 45 to 60 minutes. Document Released: 11/16/2010 Document Revised: 01/06/2012 Document Reviewed: 11/16/2010 [facility] Patient Information 2015 [place], [place]. This information is not intended to replace advice given to you by your health care provider. Make sure you discuss any questions you have with your health care provider.

## 2015-02-11 ENCOUNTER — Encounter: Payer: Self-pay | Admitting: Internal Medicine

## 2015-02-11 DIAGNOSIS — E282 Polycystic ovarian syndrome: Secondary | ICD-10-CM | POA: Insufficient documentation

## 2015-02-11 NOTE — Progress Notes (Signed)
   Subjective:    Patient ID: Katrina Larson, female    DOB: 1981/01/07, 34 y.o.   MRN: [id number]  HPI The patient is a 34 YO female who is coming in today about her PCOS. She has been diagnosed with it in the past. She was on birth control which worked for her hair growth and some of her other symptoms but it was stopped for some reason. She is now taking metformin and was increased to 3 times a day. This is giving her a lot of GI nausea and decreased appetite. She does have excessive hair growth and feels that she is unable to lose weight even though she is dieting and exercising 3 times a week. Denies pelvic pain. Denies excessive bleeding.   PMH, [facility], social history reviewed and updated.   Review of Systems  Constitutional: Positive for activity change. Negative for fever, appetite change, fatigue and unexpected weight change.       Exercising more lately  HENT: Negative.   Eyes: Negative.   Respiratory: Negative for cough, chest tightness, shortness of breath and wheezing.   Cardiovascular: Negative for chest pain, palpitations and leg swelling.  Gastrointestinal: Positive for nausea, diarrhea and abdominal distention. Negative for abdominal pain and constipation.  Musculoskeletal: Positive for myalgias and arthralgias.  Skin: Negative.   Neurological: Negative.   Psychiatric/Behavioral: Negative.       Objective:   Physical Exam  Constitutional: She is oriented to person, place, and time. She appears well-developed and well-nourished.  Overweight  HENT:  Head: Normocephalic and atraumatic.  Eyes: EOM are normal.  Neck: Normal range of motion.  Cardiovascular: Normal rate and regular rhythm.   Pulmonary/Chest: Effort normal and breath sounds normal. No respiratory distress. She has no wheezes. She has no rales.  Abdominal: Soft. Bowel sounds are normal. She exhibits no distension. There is no tenderness. There is no rebound.  Musculoskeletal: She exhibits no edema.    Neurological: She is alert and oriented to person, place, and time. Coordination normal.  Skin: Skin is warm and dry.  Psychiatric: She has a normal mood and affect.   Filed Vitals:   02/09/15 0804  BP: 116/80  Pulse: 75  Temp: 98 F (36.7 C)  TempSrc: Oral  Resp: 16  Height: 5\' 5"  (1.651 m)  Weight: 253 lb (114.76 kg)  SpO2: 98%      Assessment & Plan:

## 2015-02-11 NOTE — Assessment & Plan Note (Signed)
Rx for birth control at today's visit. No clear reason for stopping and had been helping with her symptoms. She does not wish to pursue pregnancy right now (has 1 child already and no clear plans for a second at this time). She will decrease metformin to help with side effects of it to once a day. Encouraged her to also return to Ob/Gyn for routine care.

## 2015-02-11 NOTE — Assessment & Plan Note (Signed)
Working on weight loss to help her PCOS. She is currently taking ER metformin TID. Will reduce to once a day to see if this helps with her nausea. In 2-3 weeks she will increase to twice a day if she is able. She is exercising more and working on diet. Checking labs for other complications of obesity.

## 2015-03-01 ENCOUNTER — Ambulatory Visit: Payer: 59 | Admitting: Family Medicine

## 2015-03-01 DIAGNOSIS — Z0289 Encounter for other administrative examinations: Secondary | ICD-10-CM

## 2015-03-08 ENCOUNTER — Ambulatory Visit: Payer: 59 | Admitting: Internal Medicine

## 2015-03-13 ENCOUNTER — Encounter: Payer: Self-pay | Admitting: Internal Medicine

## 2015-03-23 ENCOUNTER — Ambulatory Visit: Payer: 59 | Admitting: Dietician

## 2015-08-14 ENCOUNTER — Other Ambulatory Visit (INDEPENDENT_AMBULATORY_CARE_PROVIDER_SITE_OTHER): Payer: 59

## 2015-08-14 ENCOUNTER — Ambulatory Visit (INDEPENDENT_AMBULATORY_CARE_PROVIDER_SITE_OTHER): Payer: 59 | Admitting: Internal Medicine

## 2015-08-14 ENCOUNTER — Encounter: Payer: Self-pay | Admitting: Internal Medicine

## 2015-08-14 VITALS — BP 112/78 | HR 103 | Temp 98.3°F | Resp 12 | Ht 65.0 in | Wt 255.0 lb

## 2015-08-14 DIAGNOSIS — R5383 Other fatigue: Secondary | ICD-10-CM

## 2015-08-14 LAB — COMPREHENSIVE METABOLIC PANEL
ALK PHOS: 99 U/L (ref 39–117)
ALT: 19 U/L (ref 0–35)
AST: 17 U/L (ref 0–37)
Albumin: 4 g/dL (ref 3.5–5.2)
BUN: 11 mg/dL (ref 6–23)
CO2: 26 meq/L (ref 19–32)
Calcium: 9.3 mg/dL (ref 8.4–10.5)
Chloride: 103 mEq/L (ref 96–112)
Creatinine, Ser: 0.74 mg/dL (ref 0.40–1.20)
GFR: 95 mL/min (ref >60.00)
Glucose, Bld: 97 mg/dL (ref 70–99)
POTASSIUM: 4.1 meq/L (ref 3.5–5.1)
SODIUM: 139 meq/L (ref 135–145)
TOTAL PROTEIN: 7.7 g/dL (ref 6.0–8.3)
Total Bilirubin: 0.5 mg/dL (ref 0.2–1.2)

## 2015-08-14 LAB — VITAMIN B12: Vitamin B-12: 223 pg/mL (ref 211–911)

## 2015-08-14 LAB — HEMOGLOBIN A1C: Hgb A1c MFr Bld: 6 % (ref 4.6–6.5)

## 2015-08-14 LAB — T4, FREE: Free T4: 0.87 ng/dL (ref 0.60–1.60)

## 2015-08-14 LAB — FOLATE: FOLATE: 17.9 ng/mL (ref >5.9)

## 2015-08-14 LAB — VITAMIN D 25 HYDROXY (VIT D DEFICIENCY, FRACTURES): VITD: 36.13 ng/mL (ref 30.00–100.00)

## 2015-08-14 LAB — TSH: TSH: 2.12 u[IU]/mL (ref 0.35–4.50)

## 2015-08-14 LAB — BRAIN NATRIURETIC PEPTIDE: PRO B NATRI PEPTIDE: 28 pg/mL (ref 0.0–100.0)

## 2015-08-14 NOTE — Progress Notes (Signed)
Pre visit review using our clinic review tool, if applicable. No additional management support is needed unless otherwise documented below in the visit note. 

## 2015-08-14 NOTE — Progress Notes (Signed)
   Subjective:    Patient ID: Katrina Larson, female    DOB: 01-15-1981, 34 y.o.   MRN: [id number]  HPI The patient is a 34 YO female coming in for follow up of her PCOS and her weight. She has been trying to lose weight with change in diet and more exercise. She is talking to a dietician at work. Still feeling nauseous most of the time and not sure if it is related to medications or her underlying PCOS. Feels very tired most of the time and sleeping a lot more lately. Not having diarrhea with her metformin anymore.   Review of Systems  Constitutional: Positive for activity change. Negative for fever, appetite change, fatigue and unexpected weight change.       Exercising more lately  HENT: Negative.   Eyes: Negative.   Respiratory: Negative for cough, chest tightness, shortness of breath and wheezing.   Cardiovascular: Negative for chest pain, palpitations and leg swelling.  Gastrointestinal: Positive for nausea. Negative for abdominal pain, diarrhea, constipation and abdominal distention.  Musculoskeletal: Positive for myalgias. Negative for arthralgias.  Skin: Negative.   Neurological: Negative.   Psychiatric/Behavioral: Negative.       Objective:   Physical Exam  Constitutional: She is oriented to person, place, and time. She appears well-developed and well-nourished.  Overweight  HENT:  Head: Normocephalic and atraumatic.  Eyes: EOM are normal.  Neck: Normal range of motion.  Cardiovascular: Normal rate and regular rhythm.   Pulmonary/Chest: Effort normal and breath sounds normal. No respiratory distress. She has no wheezes. She has no rales.  Abdominal: Soft. Bowel sounds are normal. She exhibits no distension. There is no tenderness. There is no rebound.  Musculoskeletal: She exhibits no edema.  Neurological: She is alert and oriented to person, place, and time. Coordination normal.  Skin: Skin is warm and dry.  Psychiatric: She has a normal mood and affect.   Filed  Vitals:   08/14/15 0806  BP: 112/78  Pulse: 103  Temp: 98.3 F (36.8 C)  TempSrc: Oral  Resp: 12  Height: 5\' 5"  (1.651 m)  Weight: 255 lb (115.667 kg)  SpO2: 98%      Assessment & Plan:

## 2015-08-14 NOTE — Assessment & Plan Note (Signed)
Weight up about 2 pounds despite her efforts to lose weight. Talked to her about exercise 60 minutes per day 5-6 times per week to help with weight loss.

## 2015-08-14 NOTE — Assessment & Plan Note (Signed)
Checking thyroid, vitamin levels, BNP and CMP today. Checking HgA1c (since pre-diabetic last visit). Unclear if this is related to her PCOS or to some other etiology.

## 2015-08-14 NOTE — Patient Instructions (Signed)
We are checking the labs today to see if we can find a cause for the energy levels and weight stuff with the thyroid and vitamin levels.   We will call you with the results and any adjustment we need to make.

## 2015-09-13 ENCOUNTER — Encounter: Payer: Self-pay | Admitting: Family

## 2015-09-13 ENCOUNTER — Ambulatory Visit (INDEPENDENT_AMBULATORY_CARE_PROVIDER_SITE_OTHER): Payer: 59 | Admitting: Family

## 2015-09-13 VITALS — BP 118/76 | HR 77 | Temp 97.9°F | Resp 18 | Ht 65.0 in | Wt 252.8 lb

## 2015-09-13 DIAGNOSIS — J019 Acute sinusitis, unspecified: Secondary | ICD-10-CM | POA: Diagnosis not present

## 2015-09-13 MED ORDER — AMOXICILLIN-POT CLAVULANATE 875-125 MG PO TABS: 1.0000 | ORAL_TABLET | Freq: Two times a day (BID) | ORAL | Status: DC

## 2015-09-13 MED ORDER — HYDROCODONE-HOMATROPINE 5-1.5 MG/5ML PO SYRP: 5.0000 mL | ORAL_SOLUTION | Freq: Three times a day (TID) | ORAL | Status: DC | PRN

## 2015-09-13 NOTE — Patient Instructions (Signed)
Thank you for choosing [facility].  Summary/Instructions:  Your prescription(s) have been submitted to your pharmacy or been printed and provided for you. Please take as directed and contact our office if you believe you are having problem(s) with the medication(s) or have any questions.  If your symptoms worsen or fail to improve, please contact our office for further instruction, or in case of emergency go directly to the emergency room at the closest medical facility.   General Recommendations:    Please drink plenty of fluids.  Get plenty of rest   Sleep in humidified air  Use saline nasal sprays  Netti pot   OTC Medications:  Decongestants - helps relieve congestion   Flonase (generic fluticasone) or Nasacort (generic triamcinolone) - please make sure to use the "cross-over" technique at a 45 degree angle towards the opposite eye as opposed to straight up the nasal passageway.   Sudafed (generic pseudoephedrine - Note this is the one that is available behind the pharmacy counter); Products with phenylephrine (-PE) may also be used but is often not as effective as pseudoephedrine.   If you have HIGH BLOOD PRESSURE - Coricidin HBP; AVOID any product that is -D as this contains pseudoephedrine which may increase your blood pressure.  Afrin (oxymetazoline) every 6-8 hours for up to 3 days.   Allergies - helps relieve runny nose, itchy eyes and sneezing   Claritin (generic loratidine), Allegra (fexofenidine), or Zyrtec (generic cyrterizine) for runny nose. These medications should not cause drowsiness.  Note - Benadryl (generic diphenhydramine) may be used however may cause drowsiness  Cough -   Delsym or Robitussin (generic dextromethorphan)  Expectorants - helps loosen mucus to ease removal   Mucinex (generic guaifenesin) as directed on the package.  Headaches / General Aches   Tylenol (generic acetaminophen) - DO NOT EXCEED 3 grams (3,000 mg) in a 24  hour time period  Advil/Motrin (generic ibuprofen)   Sore Throat -   Salt water gargle   Chloraseptic (generic benzocaine) spray or lozenges / Sucrets (generic dyclonine)    Sinusitis Sinusitis is redness, soreness, and inflammation of the paranasal sinuses. Paranasal sinuses are air pockets within the bones of your face (beneath the eyes, the middle of the forehead, or above the eyes). In healthy paranasal sinuses, mucus is able to drain out, and air is able to circulate through them by way of your nose. However, when your paranasal sinuses are inflamed, mucus and air can become trapped. This can allow bacteria and other germs to grow and cause infection. Sinusitis can develop quickly and last only a short time (acute) or continue over a long period (chronic). Sinusitis that lasts for more than 12 weeks is considered chronic.  CAUSES  Causes of sinusitis include:  Allergies.  Structural abnormalities, such as displacement of the cartilage that separates your nostrils (deviated septum), which can decrease the air flow through your nose and sinuses and affect sinus drainage.  Functional abnormalities, such as when the small hairs (cilia) that line your sinuses and help remove mucus do not work properly or are not present. SIGNS AND SYMPTOMS  Symptoms of acute and chronic sinusitis are the same. The primary symptoms are pain and pressure around the affected sinuses. Other symptoms include:  Upper toothache.  Earache.  Headache.  Bad breath.  Decreased sense of smell and taste.  A cough, which worsens when you are lying flat.  Fatigue.  Fever.  Thick drainage from your nose, which often is green and may   contain pus (purulent).  Swelling and warmth over the affected sinuses. DIAGNOSIS  Your health care provider will perform a physical exam. During the exam, your health care provider may:  Look in your nose for signs of abnormal growths in your nostrils (nasal  polyps).  Tap over the affected sinus to check for signs of infection.  View the inside of your sinuses (endoscopy) using an imaging device that has a light attached (endoscope). If your health care provider suspects that you have chronic sinusitis, one or more of the following tests may be recommended:  Allergy tests.  Nasal culture. A sample of mucus is taken from your nose, sent to a lab, and screened for bacteria.  Nasal cytology. A sample of mucus is taken from your nose and examined by your health care provider to determine if your sinusitis is related to an allergy. TREATMENT  Most cases of acute sinusitis are related to a viral infection and will resolve on their own within 10 days. Sometimes medicines are prescribed to help relieve symptoms (pain medicine, decongestants, nasal steroid sprays, or saline sprays).  However, for sinusitis related to a bacterial infection, your health care provider will prescribe antibiotic medicines. These are medicines that will help kill the bacteria causing the infection.  Rarely, sinusitis is caused by a fungal infection. In theses cases, your health care provider will prescribe antifungal medicine. For some cases of chronic sinusitis, surgery is needed. Generally, these are cases in which sinusitis recurs more than 3 times per year, despite other treatments. HOME CARE INSTRUCTIONS   Drink plenty of water. Water helps thin the mucus so your sinuses can drain more easily.  Use a humidifier.  Inhale steam 3 to 4 times a day (for example, sit in the bathroom with the shower running).  Apply a warm, moist washcloth to your face 3 to 4 times a day, or as directed by your health care provider.  Use saline nasal sprays to help moisten and clean your sinuses.  Take medicines only as directed by your health care provider.  If you were prescribed either an antibiotic or antifungal medicine, finish it all even if you start to feel better. SEEK IMMEDIATE  MEDICAL CARE IF:  You have increasing pain or severe headaches.  You have nausea, vomiting, or drowsiness.  You have swelling around your face.  You have vision problems.  You have a stiff neck.  You have difficulty breathing. MAKE SURE YOU:   Understand these instructions.  Will watch your condition.  Will get help right away if you are not doing well or get worse. Document Released: 10/14/2005 Document Revised: 02/28/2014 Document Reviewed: 10/29/2011 [employer] Patient Information 2015 [employer]. This information is not intended to replace advice given to you by your health care provider. Make sure you discuss any questions you have with your health care provider.   

## 2015-09-13 NOTE — Progress Notes (Signed)
Subjective:    Patient ID: Katrina Larson, female    DOB: [birth year], 34 y.o.   MRN: [id number]  Chief Complaint  Patient presents with  . Cough    x2 week, productive cough, sinus pressure and headaches    HPI:  Katrina Larson is a 34 y.o. female who  has a past medical history of H/O knee surgery. and presents today for an acute office visit.   This is a new problem. Associated symptoms of productive cough, sinus pressure and headaches has been going on for about 2 weeks. Modifying factors include tea and Tylenol sinus and cold which has not helped. Course of the symptoms has remained fairly constant. Severity of the cough is worse at night and disturbs her sleep. Denies any recent antibiotics.  Allergies  Allergen Reactions  . Latex      Current Outpatient Prescriptions on File Prior to Visit  Medication Sig Dispense Refill  . cholecalciferol (VITAMIN D) 1000 UNITS tablet Take 3,000 Units by mouth daily.    . Ibuprofen-Famotidine 800-26.6 MG TABS Take 1 tablet by mouth 3 (three) times daily. 270 tablet 3  . metFORMIN (GLUCOPHAGE) 500 MG tablet Take 500 mg by mouth 3 (three) times daily.    . Multiple Vitamins-Minerals (MULTIVITAMIN WITH MINERALS) tablet Take 1 tablet by mouth daily.    . norgestimate-ethinyl estradiol (ORTHO-CYCLEN, 28,) 0.25-35 MG-MCG tablet Take 1 tablet by mouth daily. 3 Package 4   No current facility-administered medications on file prior to visit.    Review of Systems  Constitutional: Negative for fever and chills.  HENT: Positive for congestion and sore throat. Negative for sinus pressure.   Respiratory: Positive for cough. Negative for chest tightness and shortness of breath.   Cardiovascular: Negative for chest pain, palpitations and leg swelling.  Neurological: Positive for headaches.      Objective:    BP 118/76 mmHg  Pulse 77  Temp(Src) 97.9 F (36.6 C) (Oral)  Resp 18  Ht 5\' 5"  (1.651 m)  Wt 252 lb 12.8 oz (114.669 kg)   BMI 42.07 kg/m2  SpO2 98% Nursing note and vital signs reviewed.  Physical Exam  Constitutional: She is oriented to person, place, and time. She appears well-developed and well-nourished. No distress.  HENT:  Right Ear: Hearing, tympanic membrane, external ear and ear canal normal.  Left Ear: Hearing, tympanic membrane, external ear and ear canal normal.  Nose: Right sinus exhibits no maxillary sinus tenderness and no frontal sinus tenderness. Left sinus exhibits no maxillary sinus tenderness and no frontal sinus tenderness.  Mouth/Throat: Uvula is midline, oropharynx is clear and moist and mucous membranes are normal.  Neck: Neck supple.  Cardiovascular: Normal rate, regular rhythm, normal heart sounds and intact distal pulses.   Pulmonary/Chest: Effort normal and breath sounds normal.  Neurological: She is alert and oriented to person, place, and time.  Skin: Skin is warm and dry.  Psychiatric: She has a normal mood and affect. Her behavior is normal. Judgment and thought content normal.       Assessment & Plan:   Problem List Items Addressed This Visit      Respiratory   Sinusitis, acute - Primary    Symptoms and exam consistent with acute bacterial sinusitis. Start Augmentin. Start Hycodan as needed for cough and sleep. Continue over-the-counter medications as needed for symptom relief and supportive care. Follow-up if symptoms worsen or fail to improve.      Relevant Medications   amoxicillin-clavulanate (AUGMENTIN) 875-125 MG tablet  HYDROcodone-homatropine (HYCODAN) 5-1.5 MG/5ML syrup

## 2015-09-13 NOTE — Progress Notes (Signed)
Pre visit review using our clinic review tool, if applicable. No additional management support is needed unless otherwise documented below in the visit note. 

## 2015-09-13 NOTE — Assessment & Plan Note (Signed)
Symptoms and exam consistent with acute bacterial sinusitis. Start Augmentin. Start Hycodan as needed for cough and sleep. Continue over-the-counter medications as needed for symptom relief and supportive care. Follow up if symptoms worsen or fail to improve. 

## 2016-01-25 ENCOUNTER — Encounter: Payer: Self-pay | Admitting: Internal Medicine

## 2016-01-25 ENCOUNTER — Ambulatory Visit (INDEPENDENT_AMBULATORY_CARE_PROVIDER_SITE_OTHER): Payer: 59 | Admitting: Internal Medicine

## 2016-01-25 ENCOUNTER — Other Ambulatory Visit (INDEPENDENT_AMBULATORY_CARE_PROVIDER_SITE_OTHER): Payer: 59

## 2016-01-25 VITALS — BP 116/72 | HR 82 | Temp 97.8°F | Wt 251.0 lb

## 2016-01-25 DIAGNOSIS — E538 Deficiency of other specified B group vitamins: Secondary | ICD-10-CM

## 2016-01-25 DIAGNOSIS — R5383 Other fatigue: Secondary | ICD-10-CM | POA: Diagnosis not present

## 2016-01-25 LAB — URINALYSIS, ROUTINE W REFLEX MICROSCOPIC
Bilirubin Urine: NEGATIVE
Ketones, ur: NEGATIVE
NITRITE: NEGATIVE
PH: 6.5 (ref 5.0–8.0)
SPECIFIC GRAVITY, URINE: 1.02 (ref 1.000–1.030)
TOTAL PROTEIN, URINE-UPE24: NEGATIVE
URINE GLUCOSE: NEGATIVE
UROBILINOGEN UA: 0.2 (ref 0.0–1.0)

## 2016-01-25 LAB — TSH: TSH: 2.06 u[IU]/mL (ref 0.35–4.50)

## 2016-01-25 LAB — RHEUMATOID FACTOR

## 2016-01-25 LAB — CK: CK TOTAL: 85 U/L (ref 7–177)

## 2016-01-25 LAB — T4, FREE: Free T4: 0.92 ng/dL (ref 0.60–1.60)

## 2016-01-25 MED ORDER — CYANOCOBALAMIN 1000 MCG/ML IJ SOLN
1000.0000 ug | Freq: Once | INTRAMUSCULAR | Status: AC
  Administered 2016-01-25: 1000 ug via INTRAMUSCULAR

## 2016-01-25 NOTE — Progress Notes (Signed)
Pre visit review using our clinic review tool, if applicable. No additional management support is needed unless otherwise documented below in the visit note. 

## 2016-01-25 NOTE — Assessment & Plan Note (Signed)
B12 shot given today and if improvement in her symptoms would do every 2 weeks for 2 months then monthly. Her B12 was low on last blood work and she is not good about taking daily replacement.

## 2016-01-25 NOTE — Progress Notes (Signed)
   Subjective:    Patient ID: Katrina Larson, female    DOB: Dec 28, 1980, 35 y.o.   MRN: [id number]  HPI The patient is a 35 YO female coming in for debilitating fatigue. She has been struggling with it for some time but worse in the last 1-2 months. She is having some increased emotional lability due to the fatigue. This is causing her problems at work and home. She is having pain in her muscles and aching that keeps her from doing things. No fevers or chills. No new medicines. She has not been taking her B12 vitamin since last visit although she tries.   Review of Systems  Constitutional: Positive for fatigue. Negative for fever, activity change, appetite change and unexpected weight change.  Respiratory: Negative for cough, chest tightness, shortness of breath and wheezing.   Cardiovascular: Negative for chest pain, palpitations and leg swelling.  Gastrointestinal: Negative for nausea, abdominal pain, diarrhea, constipation and abdominal distention.  Musculoskeletal: Positive for myalgias. Negative for arthralgias.  Skin: Negative.   Neurological: Negative.   Psychiatric/Behavioral: Negative.       Objective:   Physical Exam  Constitutional: She is oriented to person, place, and time. She appears well-developed and well-nourished.  Overweight  HENT:  Head: Normocephalic and atraumatic.  Eyes: EOM are normal.  Neck: Normal range of motion.  Cardiovascular: Normal rate and regular rhythm.   Pulmonary/Chest: Effort normal and breath sounds normal. No respiratory distress. She has no wheezes. She has no rales.  Abdominal: Soft. She exhibits no distension. There is no tenderness. There is no rebound.  Musculoskeletal: She exhibits no edema.  Neurological: She is alert and oriented to person, place, and time. Coordination normal.  Skin: Skin is warm and dry.  Psychiatric: She has a normal mood and affect.   Filed Vitals:   01/25/16 0819  BP: 116/72  Pulse: 82  Temp: 97.8 F  (36.6 C)  Weight: 251 lb (113.853 kg)  SpO2: 98%      Assessment & Plan:  B12 shot given at visit.

## 2016-01-25 NOTE — Patient Instructions (Signed)
We have given you the B12 shot today. If you are feeling more energy in the next 2-3 days call us or send a mychart message and we will send in the shots that you can give yourself every 2 weeks for 2 months then monthly (if you would like you can come in to the office for these).   We are checking the labs and the urine today and will send the results on mychart.

## 2016-01-25 NOTE — Assessment & Plan Note (Signed)
Given the concurrent myalgias checking rheumatologic disease. Checking TSH, free T4, ANA, RF. Adjust as needed. Treating her B12 deficiency.

## 2016-01-26 LAB — ANA: ANA: NEGATIVE

## 2016-01-30 ENCOUNTER — Encounter: Payer: Self-pay | Admitting: Internal Medicine

## 2016-02-05 MED ORDER — CYANOCOBALAMIN 1000 MCG/ML IJ SOLN: 1000.0000 ug | INTRAMUSCULAR | Status: DC

## 2016-02-05 MED FILL — CYANOCOBALAMIN 1,000 MCG/ML: 1000 | 90 days supply | Qty: 3 | Fill #0

## 2016-02-12 ENCOUNTER — Ambulatory Visit (INDEPENDENT_AMBULATORY_CARE_PROVIDER_SITE_OTHER): Payer: 59 | Admitting: Internal Medicine

## 2016-02-12 ENCOUNTER — Encounter: Payer: Self-pay | Admitting: Internal Medicine

## 2016-02-12 ENCOUNTER — Other Ambulatory Visit: Payer: Self-pay | Admitting: Internal Medicine

## 2016-02-12 VITALS — BP 118/88 | HR 77 | Temp 98.1°F | Resp 16 | Ht 65.0 in | Wt 254.0 lb

## 2016-02-12 DIAGNOSIS — E538 Deficiency of other specified B group vitamins: Secondary | ICD-10-CM | POA: Diagnosis not present

## 2016-02-12 DIAGNOSIS — R32 Unspecified urinary incontinence: Secondary | ICD-10-CM | POA: Diagnosis not present

## 2016-02-12 MED ORDER — CEPHALEXIN 500 MG PO CAPS: 500.0000 mg | ORAL_CAPSULE | Freq: Three times a day (TID) | ORAL | Status: DC

## 2016-02-12 MED ORDER — NORGESTIMATE-ETH ESTRADIOL 0.25-35 MG-MCG PO TABS: 1.0000 | ORAL_TABLET | Freq: Every day | ORAL | Status: DC

## 2016-02-12 MED FILL — CEPHALEXIN 500 MG CAPSULE: 500 | 5 days supply | Qty: 15 | Fill #0

## 2016-02-12 MED FILL — MONO-LINYAH 28 TABLET: 0.25-35 | 84 days supply | Qty: 84 | Fill #0

## 2016-02-12 NOTE — Assessment & Plan Note (Signed)
She will take the B12 every 2 weeks for 2 months then monthly after that. Has helped some with her energy levels.

## 2016-02-12 NOTE — Assessment & Plan Note (Signed)
Treat for UTI based on last U/A with keflex. These symptoms are new and likely related to some infection.

## 2016-02-12 NOTE — Patient Instructions (Signed)
We have sent in keflex to take for the urine. Take 1 pill three times a day for 5 days.   It is okay to take the vitamin B12 every 2 weeks for the first 2 months then go to every 3-4 weeks.

## 2016-02-12 NOTE — Progress Notes (Signed)
Pre visit review using our clinic review tool, if applicable. No additional management support is needed unless otherwise documented below in the visit note. 

## 2016-02-12 NOTE — Progress Notes (Signed)
   Subjective:    Patient ID: Katrina Larson, female    DOB: 07-16-1981, 35 y.o.   MRN: [id number]  HPI The patient is a 35 YO female coming in for follow up of her B12 deficiency. She took the shot the last time and this helped for about 1-2 weeks. She is now feeling tired again. She is still having the urinary symptoms and is having some new mild incontinence.   Review of Systems  Constitutional: Positive for fatigue. Negative for fever, activity change, appetite change and unexpected weight change.  Respiratory: Negative for cough, chest tightness, shortness of breath and wheezing.   Cardiovascular: Negative for chest pain, palpitations and leg swelling.  Gastrointestinal: Negative for nausea, abdominal pain, diarrhea, constipation and abdominal distention.  Genitourinary:       Mild incontinence  Musculoskeletal: Positive for myalgias. Negative for arthralgias.  Skin: Negative.       Objective:   Physical Exam  Constitutional: She is oriented to person, place, and time. She appears well-developed and well-nourished.  Overweight  HENT:  Head: Normocephalic and atraumatic.  Eyes: EOM are normal.  Neck: Normal range of motion.  Cardiovascular: Normal rate and regular rhythm.   Pulmonary/Chest: Effort normal and breath sounds normal. No respiratory distress. She has no wheezes. She has no rales.  Abdominal: Soft. She exhibits no distension. There is no tenderness. There is no rebound.  Musculoskeletal: She exhibits no edema.  Neurological: She is alert and oriented to person, place, and time. Coordination normal.  Skin: Skin is warm and dry.  Psychiatric: She has a normal mood and affect.   Filed Vitals:   02/12/16 0816  BP: 118/88  Pulse: 77  Temp: 98.1 F (36.7 C)  TempSrc: Oral  Resp: 16  Height: 5\' 5"  (1.651 m)  Weight: 254 lb (115.214 kg)  SpO2: 98%      Assessment & Plan:

## 2016-04-04 MED FILL — CYANOCOBALAMIN 1,000 MCG/ML: 1000 | 90 days supply | Qty: 3 | Fill #0

## 2016-06-28 MED FILL — MONO-LINYAH 28 TABLET: 0.25-35 | 84 days supply | Qty: 84 | Fill #1

## 2016-06-28 MED FILL — CYANOCOBALAMIN 1,000 MCG/ML: 1000 | 90 days supply | Qty: 3 | Fill #1

## 2016-09-04 DIAGNOSIS — E669 Obesity, unspecified: Secondary | ICD-10-CM | POA: Diagnosis not present

## 2016-09-04 DIAGNOSIS — L659 Nonscarring hair loss, unspecified: Secondary | ICD-10-CM | POA: Diagnosis not present

## 2016-09-04 DIAGNOSIS — Z6841 Body Mass Index (BMI) 40.0 and over, adult: Secondary | ICD-10-CM | POA: Diagnosis not present

## 2016-09-04 DIAGNOSIS — Z01419 Encounter for gynecological examination (general) (routine) without abnormal findings: Secondary | ICD-10-CM | POA: Diagnosis not present

## 2016-09-04 DIAGNOSIS — N760 Acute vaginitis: Secondary | ICD-10-CM | POA: Diagnosis not present

## 2016-09-04 DIAGNOSIS — R635 Abnormal weight gain: Secondary | ICD-10-CM | POA: Diagnosis not present

## 2016-09-04 DIAGNOSIS — B373 Candidiasis of vulva and vagina: Secondary | ICD-10-CM | POA: Diagnosis not present

## 2016-09-04 DIAGNOSIS — Z01411 Encounter for gynecological examination (general) (routine) with abnormal findings: Secondary | ICD-10-CM | POA: Diagnosis not present

## 2016-09-04 DIAGNOSIS — R112 Nausea with vomiting, unspecified: Secondary | ICD-10-CM | POA: Diagnosis not present

## 2016-09-04 MED FILL — PANTOPRAZOLE SOD DR 40 MG T: 40 | 30 days supply | Qty: 30 | Fill #0

## 2016-09-04 MED FILL — FLUCONAZOLE 150 MG TABLET: 150 | 1 days supply | Qty: 1 | Fill #0

## 2016-09-04 MED FILL — VANDAZOLE VAGINAL 0.75% GEL: 0.75 | 5 days supply | Qty: 70 | Fill #0

## 2016-09-05 ENCOUNTER — Other Ambulatory Visit (HOSPITAL_COMMUNITY): Payer: Self-pay | Admitting: Obstetrics & Gynecology

## 2016-09-05 DIAGNOSIS — R1011 Right upper quadrant pain: Secondary | ICD-10-CM

## 2016-09-05 DIAGNOSIS — R110 Nausea: Secondary | ICD-10-CM

## 2016-09-10 ENCOUNTER — Ambulatory Visit (HOSPITAL_COMMUNITY): Payer: 59

## 2016-09-12 ENCOUNTER — Encounter: Payer: Self-pay | Admitting: Internal Medicine

## 2016-09-12 ENCOUNTER — Ambulatory Visit (INDEPENDENT_AMBULATORY_CARE_PROVIDER_SITE_OTHER): Payer: 59 | Admitting: Internal Medicine

## 2016-09-12 ENCOUNTER — Other Ambulatory Visit (INDEPENDENT_AMBULATORY_CARE_PROVIDER_SITE_OTHER): Payer: 59

## 2016-09-12 VITALS — BP 124/76 | HR 75 | Temp 98.5°F | Resp 18 | Ht 65.0 in | Wt 252.0 lb

## 2016-09-12 DIAGNOSIS — R1032 Left lower quadrant pain: Secondary | ICD-10-CM

## 2016-09-12 LAB — CBC
HCT: 37.8 % (ref 36.0–46.0)
Hemoglobin: 12.4 g/dL (ref 12.0–15.0)
MCHC: 32.8 g/dL (ref 30.0–36.0)
MCV: 77.3 fl — ABNORMAL LOW (ref 78.0–100.0)
Platelets: 345 10*3/uL (ref 150.0–400.0)
RBC: 4.89 Mil/uL (ref 3.87–5.11)
RDW: 15.6 % — ABNORMAL HIGH (ref 11.5–15.5)
WBC: 7.1 10*3/uL (ref 4.0–10.5)

## 2016-09-12 LAB — COMPREHENSIVE METABOLIC PANEL
ALT: 20 U/L (ref 0–35)
AST: 20 U/L (ref 0–37)
Albumin: 4.3 g/dL (ref 3.5–5.2)
Alkaline Phosphatase: 113 U/L (ref 39–117)
BUN: 8 mg/dL (ref 6–23)
CO2: 28 mEq/L (ref 19–32)
Calcium: 9.4 mg/dL (ref 8.4–10.5)
Chloride: 103 mEq/L (ref 96–112)
Creatinine, Ser: 0.85 mg/dL (ref 0.40–1.20)
GFR: 80.46 mL/min (ref >60.00)
Glucose, Bld: 95 mg/dL (ref 70–99)
Potassium: 4.1 mEq/L (ref 3.5–5.1)
Sodium: 140 mEq/L (ref 135–145)
Total Bilirubin: 0.4 mg/dL (ref 0.2–1.2)
Total Protein: 8.1 g/dL (ref 6.0–8.3)

## 2016-09-12 LAB — LIPASE: Lipase: 15 U/L (ref 11.0–59.0)

## 2016-09-12 MED ORDER — TRAMADOL HCL 50 MG PO TABS: 50.0000 mg | ORAL_TABLET | Freq: Three times a day (TID) | ORAL | 0 refills | Status: DC | PRN

## 2016-09-12 MED FILL — traMADol HCL 50 MG TABS: 50 | 10 days supply | Qty: 30 | Fill #0

## 2016-09-12 NOTE — Progress Notes (Signed)
Pre visit review using our clinic review tool, if applicable. No additional management support is needed unless otherwise documented below in the visit note. 

## 2016-09-12 NOTE — Assessment & Plan Note (Signed)
Checking CMP, CBC, lipase and CT abdomen and pelvis. I think the most likely etiology is ovarian cyst given her hx pcos. Rx for tramadol for pain today. If she gets dehydrated she is advised to go to ER. She does not want to do that.

## 2016-09-12 NOTE — Progress Notes (Signed)
   Subjective:    Patient ID: Katrina Larson, female    DOB: 09/14/1981, 35 y.o.   MRN: [id number]  HPI The patient is a 35 YO female coming in for lower abdominal pain for the last 3 days. In the left lower side and can go across the lower abdomen. She has PCOS known and hx appendix removal in the past. Is having nausea with it and vomiting on the first day. She has not been eating at all since this worsens the pain. She is drinking small amounts of liquid and they make the pain worse as well. 1 episode of diarrhea at the onset and none since. No blood in the bowel movements. Denies constipation. No fevers or chills.   Review of Systems  Constitutional: Positive for activity change, appetite change and fatigue. Negative for chills, fever and unexpected weight change.  Respiratory: Negative.   Cardiovascular: Negative.   Gastrointestinal: Positive for abdominal pain, nausea and vomiting. Negative for abdominal distention, blood in stool, constipation and diarrhea.  Musculoskeletal: Negative.   Neurological: Negative.       Objective:   Physical Exam  Constitutional: She is oriented to person, place, and time. She appears well-developed and well-nourished.  In mild distress  HENT:  Head: Normocephalic and atraumatic.  Eyes: EOM are normal.  Neck: Normal range of motion.  Cardiovascular: Normal rate and regular rhythm.   Pulmonary/Chest: Effort normal. No respiratory distress. She has no wheezes. She has no rales.  Abdominal: Soft. Bowel sounds are normal. She exhibits no distension and no mass. There is tenderness. There is no rebound and no guarding.  Pain in the LLQ and some in the RLQ, no radiation or guarding.   Neurological: She is alert and oriented to person, place, and time.  Skin: Skin is warm and dry.   Vitals:   09/12/16 0927  BP: 124/76  Pulse: 75  Resp: 18  Temp: 98.5 F (36.9 C)  TempSrc: Oral  SpO2: 98%  Weight: 252 lb (114.3 kg)  Height: 5\' 5"  (1.651 m)       Assessment & Plan:

## 2016-09-12 NOTE — Patient Instructions (Signed)
We are going to check the labs today and also the CT scan of the stomach and pelvis to check for the cause of the pain.  We will call you back with the results.  We have given you tramadol to try for the pain that you can use.   If this comes back ovary cysts then ibuprofen or aleve is the best medicine for that pain.

## 2016-09-13 ENCOUNTER — Ambulatory Visit (HOSPITAL_COMMUNITY)
Admission: RE | Admit: 2016-09-13 | Discharge: 2016-09-13 | Disposition: A | Discharge status: Home / Self Care | Payer: 59 | Source: Ambulatory Visit | Attending: Obstetrics & Gynecology | Admitting: Obstetrics & Gynecology

## 2016-09-13 DIAGNOSIS — K8020 Calculus of gallbladder without cholecystitis without obstruction: Secondary | ICD-10-CM | POA: Diagnosis not present

## 2016-09-13 DIAGNOSIS — R110 Nausea: Secondary | ICD-10-CM | POA: Insufficient documentation

## 2016-09-13 DIAGNOSIS — R1011 Right upper quadrant pain: Secondary | ICD-10-CM | POA: Diagnosis not present

## 2016-09-23 ENCOUNTER — Ambulatory Visit
Admission: RE | Admit: 2016-09-23 | Discharge: 2016-09-23 | Disposition: A | Discharge status: Home / Self Care | Payer: 59 | Source: Ambulatory Visit | Attending: Internal Medicine | Admitting: Internal Medicine

## 2016-09-23 DIAGNOSIS — R1032 Left lower quadrant pain: Secondary | ICD-10-CM

## 2016-09-23 MED ORDER — IOPAMIDOL (ISOVUE-300) INJECTION 61%
125.0000 mL | Freq: Once | INTRAVENOUS | Status: AC | PRN
  Administered 2016-09-23: 125 mL via INTRAVENOUS

## 2016-09-24 ENCOUNTER — Encounter: Payer: Self-pay | Admitting: Internal Medicine

## 2016-09-25 NOTE — Telephone Encounter (Signed)
Patient called said she was returning your call. PLease follow up with her. Thank you.

## 2016-09-27 ENCOUNTER — Ambulatory Visit: Payer: Self-pay | Admitting: Surgery

## 2016-09-27 DIAGNOSIS — K8050 Calculus of bile duct without cholangitis or cholecystitis without obstruction: Secondary | ICD-10-CM | POA: Diagnosis not present

## 2016-09-27 NOTE — H&P (Signed)
Entered in error

## 2016-09-30 ENCOUNTER — Encounter: Payer: Self-pay | Admitting: Nurse Practitioner

## 2016-10-09 ENCOUNTER — Ambulatory Visit (INDEPENDENT_AMBULATORY_CARE_PROVIDER_SITE_OTHER): Payer: 59 | Admitting: Nurse Practitioner

## 2016-10-09 ENCOUNTER — Telehealth: Payer: Self-pay | Admitting: Nurse Practitioner

## 2016-10-09 ENCOUNTER — Encounter: Payer: Self-pay | Admitting: Nurse Practitioner

## 2016-10-09 VITALS — BP 120/78 | HR 88 | Ht 65.0 in | Wt 255.6 lb

## 2016-10-09 DIAGNOSIS — K5900 Constipation, unspecified: Secondary | ICD-10-CM | POA: Diagnosis not present

## 2016-10-09 DIAGNOSIS — R9389 Abnormal findings on diagnostic imaging of other specified body structures: Secondary | ICD-10-CM

## 2016-10-09 DIAGNOSIS — R938 Abnormal findings on diagnostic imaging of other specified body structures: Secondary | ICD-10-CM | POA: Diagnosis not present

## 2016-10-09 DIAGNOSIS — K625 Hemorrhage of anus and rectum: Secondary | ICD-10-CM

## 2016-10-09 DIAGNOSIS — K6289 Other specified diseases of anus and rectum: Secondary | ICD-10-CM

## 2016-10-09 MED ORDER — NA SULFATE-K SULFATE-MG SULF 17.5-3.13-1.6 GM/177ML PO SOLN: 1.0000 | Freq: Once | ORAL | 0 refills | Status: AC

## 2016-10-09 MED FILL — PANTOPRAZOLE SOD DR 40 MG T: 40 | 90 days supply | Qty: 90 | Fill #1

## 2016-10-09 MED FILL — CYANOCOBALAMIN 1,000 MCG/ML: 1000 | 90 days supply | Qty: 3 | Fill #2

## 2016-10-09 NOTE — Progress Notes (Addendum)
HPI: Patient is a 35 year old female with PCOS, b12 deficiency. She is referred by Dr. Romana Juniper, MD with [facility] for abdominal pain and GERD. Patient scheduled for cholecystectomy 11/01/16 for symptomatic cholelithiasis (ultrasound).  Surgery wanted GI to address patient's chronic GERD as well as some lower abdominal pain in setting of abnormal rectal finding on CTscan.      As far gas chronic GERD, patient on daily PPI but gets breakthrough heartburn / "indigestion"  sometimes after meals. She takes Tums as needed. Her biggest concern is that of nearly constant rectal pressure. A month or so ago patient became constipated, took Miralax which led to diarrhea. Bowels straightened out but she has had intermittent rectal bleeding since then (3-4 episodes) which have not been from straining. Saw PCP mid November for acute lower abdominal pain. CTscan showed mild inflammatory changes in rectal wall and perirectal fat. She still gets  intermittent lower abdominal pain with certain foods.   Hgb 12.4 on 11/16. MCV only 77. Has PCOS and periods irregular but not heavy and only menstruates every 3 months. TSH normal in late March   Past Medical History:  Diagnosis Date  . Gallstones   . GERD (gastroesophageal reflux disease)   . H/O knee surgery   . Hepatic steatosis   . Obesity   . PCOS (polycystic ovarian syndrome)     Past Surgical History:  Procedure Laterality Date  . APPENDECTOMY    . KNEE SURGERY Right   . TONSILLECTOMY     Family History  Problem Relation Age of Onset  . Colon cancer Neg Hx   . Esophageal cancer Neg Hx   . Pancreatic cancer Neg Hx   . Stomach cancer Neg Hx   . Liver disease Neg Hx    Social History  Substance Use Topics  . Smoking status: Never Smoker  . Smokeless tobacco: Never Used  . Alcohol use Not on file   Current Outpatient Prescriptions  Medication Sig Dispense Refill  . cholecalciferol (VITAMIN D) 1000 UNITS tablet Take 3,000 Units by mouth  daily.    . cyanocobalamin (,VITAMIN B-12,) 1000 MCG/ML injection INJECT 1 ML (1,000 MCG TOTAL) INTO THE MUSCLE EVERY 30 DAYS. 4 mL 3  . Multiple Vitamins-Minerals (MULTIVITAMIN WITH MINERALS) tablet Take 1 tablet by mouth daily.    . pantoprazole (PROTONIX) 40 MG tablet Take 40 mg by mouth daily.    . Polyethylene Glycol 3350 (MIRALAX PO) Take 17 g by mouth daily.    . Thiamine HCl (VITAMIN B-1) 250 MG tablet Take 250 mg by mouth daily. Reported on 02/12/2016    . traMADol (ULTRAM) 50 MG tablet Take 1 tablet (50 mg total) by mouth every 8 (eight) hours as needed. 30 tablet 0   No current facility-administered medications for this visit.    Allergies  Allergen Reactions  . Latex     Review of Systems: Positive for allergy,sinus trouble, and fatigue. All other systems reviewed and negative except where noted in HPI.   Physical Exam: BP 120/78   Pulse 88   Ht 5\' 5"  (1.651 m)   Wt 255 lb 9.6 oz (115.9 kg)   BMI 42.53 kg/m  Constitutional:  Well-developed, white female in no acute distress. Psychiatric: Normal mood and affect. Behavior is normal. HEENT: Normocephalic and atraumatic. Conjunctivae are normal. No scleral icterus. Neck supple.  Cardiovascular: Normal rate, regular rhythm.  Pulmonary/chest: Effort normal and breath sounds normal. No wheezing, rales or rhonchi. Abdominal: Soft, nondistended, nontender.  Bowel sounds active throughout. There are no masses palpable. No hepatomegaly. Extremities: no edema Lymphadenopathy: No cervical adenopathy noted. Neurological: Alert and oriented to person place and time. Skin: Skin is warm and dry. No rashes noted.   ASSESSMENT AND PLAN:  41. 35 year old female with post-prandial nausea / upper abdominal discomfort and cholelithiasis (on u/s). Scheduled for cholecystectomy on 11/01/16 for symptomatic cholelithiasis.   2. Chronic GERD. Breakthrough heartburn and "indigestion" on PPI, takes tums as needed.  -continue PPI, tums prn.    -anti-reflux measures.  -She may need eventual EGD but right now we have a Friday opening in Endoscopy which I plan to use for her colonoscopy,  see #3.   3.  Rectal pressure, intermittent lower abdominal pain and rectal bleeding.  Symptoms in face of CT scan showing mild inflammation / stranding in rectum.  -Patient will be scheduled for a colonoscopy with possible biopsy / polypectomy.  The risks and benefits of the procedure were discussed and the patient agrees to proceed.   4. Liver steatosis. Normal LFTs.    5. PCOS, on metformin   Tye Savoy, NP  10/09/2016, 10:00 AM  Cc: Pricilla Holm,  MD  Thank you for sending this case to me. I have reviewed the entire note, and the outlined plan seems appropriate.  The patient is scheduled to have her procedure today.

## 2016-10-09 NOTE — Telephone Encounter (Signed)
Left voicemail advising patient that since we are getting colonoscopy Friday, Katrina Larson would like to hold off on suppository prescription.

## 2016-10-09 NOTE — Patient Instructions (Signed)

## 2016-10-09 NOTE — Telephone Encounter (Signed)
Katrina Larson, since getting the colonoscopy so quickly I want to wait. Thanks

## 2016-10-09 NOTE — Telephone Encounter (Signed)
Katrina Larson- Patient states that she was told she had hemorrhoids and may need a steroid suppository. I dont see any mention in your note. Are we sending an rx or are you waiting until after Friday colonoscopy?

## 2016-10-11 ENCOUNTER — Ambulatory Visit (AMBULATORY_SURGERY_CENTER): Payer: 59 | Admitting: Gastroenterology

## 2016-10-11 ENCOUNTER — Encounter: Payer: Self-pay | Admitting: Gastroenterology

## 2016-10-11 VITALS — BP 102/66 | HR 68 | Temp 98.0°F | Resp 16 | Ht 65.0 in | Wt 255.0 lb

## 2016-10-11 DIAGNOSIS — K921 Melena: Secondary | ICD-10-CM

## 2016-10-11 DIAGNOSIS — R9389 Abnormal findings on diagnostic imaging of other specified body structures: Secondary | ICD-10-CM

## 2016-10-11 DIAGNOSIS — D122 Benign neoplasm of ascending colon: Secondary | ICD-10-CM | POA: Diagnosis not present

## 2016-10-11 DIAGNOSIS — K625 Hemorrhage of anus and rectum: Secondary | ICD-10-CM

## 2016-10-11 DIAGNOSIS — Z1211 Encounter for screening for malignant neoplasm of colon: Secondary | ICD-10-CM | POA: Diagnosis not present

## 2016-10-11 DIAGNOSIS — R938 Abnormal findings on diagnostic imaging of other specified body structures: Secondary | ICD-10-CM

## 2016-10-11 MED ORDER — SODIUM CHLORIDE 0.9 % IV SOLN: 500.0000 mL | INTRAVENOUS | Status: DC

## 2016-10-11 NOTE — Progress Notes (Signed)
Called to room to assist during endoscopic procedure.  Patient ID and intended procedure confirmed with present staff. Received instructions for my participation in the procedure from the performing physician.  

## 2016-10-11 NOTE — Progress Notes (Signed)
A and O x3. Report to RN. Tolerated MAC anesthesia well. 

## 2016-10-11 NOTE — Patient Instructions (Signed)
Impression/Recommendations:  Polyp handout given to patient.  Repeat colonoscopy recommended for surveillance, date to be determined after pathology results.  YOU HAD AN ENDOSCOPIC PROCEDURE TODAY AT [facility]:   Refer to the procedure report that was given to you for any specific questions about what was found during the examination.  If the procedure report does not answer your questions, please call your gastroenterologist to clarify.  If you requested that your care partner not be given the details of your procedure findings, then the procedure report has been included in a sealed envelope for you to review at your convenience later.  YOU SHOULD EXPECT: Some feelings of bloating in the abdomen. Passage of more gas than usual.  Walking can help get rid of the air that was put into your GI tract during the procedure and reduce the bloating. If you had a lower endoscopy (such as a colonoscopy or flexible sigmoidoscopy) you may notice spotting of blood in your stool or on the toilet paper. If you underwent a bowel prep for your procedure, you may not have a normal bowel movement for a few days.  Please Note:  You might notice some irritation and congestion in your nose or some drainage.  This is from the oxygen used during your procedure.  There is no need for concern and it should clear up in a day or so.  SYMPTOMS TO REPORT IMMEDIATELY:   Following lower endoscopy (colonoscopy or flexible sigmoidoscopy):  Excessive amounts of blood in the stool  Significant tenderness or worsening of abdominal pains  Swelling of the abdomen that is new, acute  Fever of 100F or higher For urgent or emergent issues, a gastroenterologist can be reached at any hour by calling [phone].   DIET:  We do recommend a small meal at first, but then you may proceed to your regular diet.  Drink plenty of fluids but you should avoid alcoholic beverages for 24 hours.  ACTIVITY:  You should plan  to take it easy for the rest of today and you should NOT DRIVE or use heavy machinery until tomorrow (because of the sedation medicines used during the test).    FOLLOW UP: Our staff will call the number listed on your records the next business day following your procedure to check on you and address any questions or concerns that you may have regarding the information given to you following your procedure. If we do not reach you, we will leave a message.  However, if you are feeling well and you are not experiencing any problems, there is no need to return our call.  We will assume that you have returned to your regular daily activities without incident.  If any biopsies were taken you will be contacted by phone or by letter within the next 1-3 weeks.  Please call us at [phone] if you have not heard about the biopsies in 3 weeks.    SIGNATURES/CONFIDENTIALITY: You and/or your care partner have signed paperwork which will be entered into your electronic medical record.  These signatures attest to the fact that that the information above on your After Visit Summary has been reviewed and is understood.  Full responsibility of the confidentiality of this discharge information lies with you and/or your care-partner.

## 2016-10-11 NOTE — Op Note (Signed)
[facility] [place] Patient Name: Katrina Larson Procedure Date: 10/11/2016 9:36 AM MRN: [id number] Endoscopist: [place]. Loletha Carrow , MD Age: 35 Referring MD:  Date of Birth: 04/11/1981 Gender: Female Account #: [id number] Procedure:                Colonoscopy Indications:              Rectal bleeding, Abnormal CT of the GI tract Medicines:                Monitored Anesthesia Care Procedure:                Pre-Anesthesia Assessment:                           - Prior to the procedure, a History and Physical                            was performed, and patient medications and                            allergies were reviewed. The patient's tolerance of                            previous anesthesia was also reviewed. The risks                            and benefits of the procedure and the sedation                            options and risks were discussed with the patient.                            All questions were answered, and informed consent                            was obtained. Prior Anticoagulants: The patient has                            taken no previous anticoagulant or antiplatelet                            agents. ASA Grade Assessment: II - A patient with                            mild systemic disease. After reviewing the risks                            and benefits, the patient was deemed in                            satisfactory condition to undergo the procedure.                           After obtaining informed consent, the colonoscope  was passed under direct vision. Throughout the                            procedure, the patient's blood pressure, pulse, and                            oxygen saturations were monitored continuously. The                            Model CF-HQ190L [id number]) scope was introduced                            through the anus and advanced to the the cecum,                            identified  by appendiceal orifice and ileocecal                            valve. The colonoscopy was performed without                            difficulty. The patient tolerated the procedure                            well. The quality of the bowel preparation was                            excellent. The ileocecal valve, appendiceal                            orifice, and rectum were photographed. The quality                            of the bowel preparation was evaluated using the                            BBPS [facility] Bowel Preparation Scale) with scores                            of: Right Colon = 3, Transverse Colon = 3 and Left                            Colon = 3 (entire mucosa seen well with no residual                            staining, small fragments of stool or opaque                            liquid). The total BBPS score equals 9. The bowel                            preparation used was SUPREP. Scope In: [id number] AM Scope Out: 10:00:51 AM Scope Withdrawal Time:  0 hours 7 minutes 17 seconds  Total Procedure Duration: 0 hours 14 minutes 47 seconds  Findings:                 The perianal and digital rectal examinations were                            normal. Specifically, no anal fissure.                           A 4 mm polyp was found in the distal ascending                            colon. The polyp was sessile. The polyp was removed                            with a cold snare. Resection and retrieval were                            complete.                           The exam was otherwise without abnormality on                            direct and retroflexion views. Complications:            No immediate complications. Estimated Blood Loss:     Estimated blood loss: none. Impression:               - One 4 mm polyp in the distal ascending colon,                            removed with a cold snare. Resected and retrieved.                           - The examination was  otherwise normal on direct                            and retroflexion views.                           No rectal abnormality to account for reported                            peri-rectal stranding seen on CT scan.                           Patient appears to have had rectal pain and benign                            bleeding related to an episode of constipation. Recommendation:           - Patient has a contact number available for  emergencies. The signs and symptoms of potential                            delayed complications were discussed with the                            patient. Return to normal activities tomorrow.                            Written discharge instructions were provided to the                            patient.                           - Resume previous diet.                           - Continue present medications.                           - Await pathology results.                           - Repeat colonoscopy is recommended for                            surveillance. The colonoscopy date will be                            determined after pathology results from today's                            exam become available for review. Pilar Corrales L. Loletha Carrow, MD 10/11/2016 10:11:06 AM This report has been signed electronically. CC Letter to:             Pricilla Holm, MD

## 2016-10-14 ENCOUNTER — Telehealth: Payer: Self-pay | Admitting: *Deleted

## 2016-10-14 NOTE — Telephone Encounter (Signed)
  Follow up Call-  Call back number 10/11/2016  Post procedure Call Back phone  # [phone]  Permission to leave phone message Yes  Some recent data might be hidden     Patient questions:  Do you have a fever, pain , or abdominal swelling? No. Pain Score  0 *  Have you tolerated food without any problems? Yes.    Have you been able to return to your normal activities? Yes.    Do you have any questions about your discharge instructions: Diet   No. Medications  No. Follow up visit  No.  Do you have questions or concerns about your Care? No.  Actions: * If pain score is 4 or above: No action needed, pain <4.

## 2016-10-16 ENCOUNTER — Encounter: Payer: Self-pay | Admitting: Gastroenterology

## 2016-10-30 ENCOUNTER — Encounter (HOSPITAL_COMMUNITY): Payer: Self-pay

## 2016-10-30 ENCOUNTER — Other Ambulatory Visit (HOSPITAL_COMMUNITY): Payer: Self-pay | Admitting: *Deleted

## 2016-10-30 ENCOUNTER — Encounter (HOSPITAL_COMMUNITY)
Admission: RE | Admit: 2016-10-30 | Discharge: 2016-10-30 | Disposition: A | Discharge status: Home / Self Care | Payer: 59 | Source: Ambulatory Visit | Attending: Surgery | Admitting: Surgery

## 2016-10-30 DIAGNOSIS — K219 Gastro-esophageal reflux disease without esophagitis: Secondary | ICD-10-CM | POA: Diagnosis not present

## 2016-10-30 DIAGNOSIS — K811 Chronic cholecystitis: Secondary | ICD-10-CM | POA: Diagnosis not present

## 2016-10-30 DIAGNOSIS — Z01818 Encounter for other preprocedural examination: Secondary | ICD-10-CM

## 2016-10-30 DIAGNOSIS — Z91040 Latex allergy status: Secondary | ICD-10-CM | POA: Diagnosis not present

## 2016-10-30 LAB — CBC
HEMATOCRIT: 36.9 % (ref 36.0–46.0)
HEMOGLOBIN: 11.9 g/dL — AB (ref 12.0–15.0)
MCH: 25.7 pg — AB (ref 26.0–34.0)
MCHC: 32.2 g/dL (ref 30.0–36.0)
MCV: 79.7 fL (ref 78.0–100.0)
Platelets: 253 10*3/uL (ref 150–400)
RBC: 4.63 MIL/uL (ref 3.87–5.11)
RDW: 14.9 % (ref 11.5–15.5)
WBC: 7.4 10*3/uL (ref 4.0–10.5)

## 2016-10-30 LAB — COMPREHENSIVE METABOLIC PANEL
ALBUMIN: 3.9 g/dL (ref 3.5–5.0)
ALK PHOS: 95 U/L (ref 38–126)
ALT: 23 U/L (ref 14–54)
ANION GAP: 9 (ref 5–15)
AST: 25 U/L (ref 15–41)
BUN: 13 mg/dL (ref 6–20)
CO2: 22 mmol/L (ref 22–32)
Calcium: 9.1 mg/dL (ref 8.9–10.3)
Chloride: 108 mmol/L (ref 101–111)
Creatinine, Ser: 0.77 mg/dL (ref 0.44–1.00)
GFR calc Af Amer: >60 mL/min (ref >60)
GFR calc non Af Amer: >60 mL/min (ref >60)
GLUCOSE: 96 mg/dL (ref 65–99)
POTASSIUM: 4.2 mmol/L (ref 3.5–5.1)
SODIUM: 139 mmol/L (ref 135–145)
Total Bilirubin: 0.4 mg/dL (ref 0.3–1.2)
Total Protein: 7.4 g/dL (ref 6.5–8.1)

## 2016-10-30 LAB — HCG, SERUM, QUALITATIVE: Preg, Serum: NEGATIVE

## 2016-10-30 NOTE — Progress Notes (Signed)
Pt denies cardiac history, chest pain or sob. Pt states she is not diabetic. 

## 2016-10-30 NOTE — Pre-Procedure Instructions (Signed)
Katrina Larson  10/30/2016    Your procedure is scheduled on Friday, November 01, 2016 at 12:00 Noon.   Report to [facility] Entrance "A" Admitting Office at 10:00 AM.   Call this number if you have problems the morning of surgery: [phone]   Questions prior to day of surgery, please call [phone] between 8 & 4 PM.   Remember:  Do not eat food or drink liquids after midnight Thursday, 10/31/16.  Take these medicines the morning of surgery with A SIP OF WATER: Pantoprazole (Protonix), Tramadol or Tylenol - if needed  Stop Vitamins as of today. Do not use Aspirin products or NSAIDS (Ibuprofen, Aleve, etc) prior to surgery.   Do not wear jewelry, make-up or nail polish.  Do not wear lotions, powders, or perfumes.  Do not shave 48 hours prior to surgery.    Do not bring valuables to the hospital.  [facility] is not responsible for any belongings or valuables.  Contacts, dentures or bridgework may not be worn into surgery.  Leave your suitcase in the car.  After surgery it may be brought to your room.  For patients admitted to the hospital, discharge time will be determined by your treatment team.  Patients discharged the day of surgery will not be allowed to drive home.   Special instructions:  [place] - Preparing for Surgery  Before surgery, you can play an important role.  Because skin is not sterile, your skin needs to be as free of germs as possible.  You can reduce the number of germs on you skin by washing with CHG (chlorahexidine gluconate) soap before surgery.  CHG is an antiseptic cleaner which kills germs and bonds with the skin to continue killing germs even after washing.  Please DO NOT use if you have an allergy to CHG or antibacterial soaps.  If your skin becomes reddened/irritated stop using the CHG and inform your nurse when you arrive at Short Stay.  Do not shave (including legs and underarms) for at least 48 hours prior to the first CHG  shower.  You may shave your face.  Please follow these instructions carefully:   1.  Shower with CHG Soap the night before surgery and the                    morning of Surgery.  2.  If you choose to wash your hair, wash your hair first as usual with your       normal shampoo.  3.  After you shampoo, rinse your hair and body thoroughly to remove the hampoo.  4.  Use CHG as you would any other liquid soap.  You can apply chg directly       to the skin and wash gently with scrungie or a clean washcloth.  5.  Apply the CHG Soap to your body ONLY FROM THE NECK DOWN.        Do not use on open wounds or open sores.  Avoid contact with your eyes, ears, mouth and genitals (private parts).  Wash genitals (private parts) with your normal soap.  6.  Wash thoroughly, paying special attention to the area where your surgery        will be performed.  7.  Thoroughly rinse your body with warm water from the neck down.  8.  DO NOT shower/wash with your normal soap after using and rinsing off       the CHG Soap.  9.  Pat yourself dry with a clean towel.            10.  Wear clean pajamas.            11.  Place clean sheets on your bed the night of your first shower and do not        sleep with pets.  Day of Surgery  Do not apply any lotions the morning of surgery.  Please wear clean clothes to the hospital.   Please read over the fact sheets that you were given.

## 2016-11-01 ENCOUNTER — Ambulatory Visit (HOSPITAL_COMMUNITY): Payer: 59 | Admitting: Anesthesiology

## 2016-11-01 ENCOUNTER — Encounter (HOSPITAL_COMMUNITY)
Admission: RE | Disposition: A | Discharge status: Home / Self Care | Payer: Self-pay | Source: Ambulatory Visit | Attending: Surgery

## 2016-11-01 ENCOUNTER — Ambulatory Visit (HOSPITAL_COMMUNITY)
Admission: RE | Admit: 2016-11-01 | Discharge: 2016-11-01 | Disposition: A | Discharge status: Home / Self Care | Payer: 59 | Source: Ambulatory Visit | Attending: Surgery | Admitting: Surgery

## 2016-11-01 DIAGNOSIS — Z91040 Latex allergy status: Secondary | ICD-10-CM | POA: Diagnosis not present

## 2016-11-01 DIAGNOSIS — K811 Chronic cholecystitis: Secondary | ICD-10-CM | POA: Diagnosis not present

## 2016-11-01 DIAGNOSIS — K219 Gastro-esophageal reflux disease without esophagitis: Secondary | ICD-10-CM | POA: Insufficient documentation

## 2016-11-01 DIAGNOSIS — M545 Low back pain: Secondary | ICD-10-CM | POA: Diagnosis not present

## 2016-11-01 DIAGNOSIS — K8050 Calculus of bile duct without cholangitis or cholecystitis without obstruction: Secondary | ICD-10-CM | POA: Diagnosis not present

## 2016-11-01 HISTORY — PX: CHOLECYSTECTOMY: SHX55

## 2016-11-01 SURGERY — LAPAROSCOPIC CHOLECYSTECTOMY
Anesthesia: General | Site: Abdomen

## 2016-11-01 MED ORDER — ONDANSETRON HCL 4 MG/2ML IJ SOLN
4.0000 mg | Freq: Once | INTRAMUSCULAR | Status: AC | PRN
  Administered 2016-11-01: 4 mg via INTRAVENOUS

## 2016-11-01 MED ORDER — LACTATED RINGERS IV SOLN
INTRAVENOUS | Status: DC | PRN
  Administered 2016-11-01 (×2): via INTRAVENOUS

## 2016-11-01 MED ORDER — FENTANYL CITRATE (PF) 100 MCG/2ML IJ SOLN
INTRAMUSCULAR | Status: AC
  Filled 2016-11-01: qty 2

## 2016-11-01 MED ORDER — SUGAMMADEX SODIUM 200 MG/2ML IV SOLN
INTRAVENOUS | Status: AC
  Filled 2016-11-01: qty 4

## 2016-11-01 MED ORDER — CHLORHEXIDINE GLUCONATE CLOTH 2 % EX PADS: 6.0000 | MEDICATED_PAD | Freq: Once | CUTANEOUS | Status: DC

## 2016-11-01 MED ORDER — MIDAZOLAM HCL 5 MG/5ML IJ SOLN
INTRAMUSCULAR | Status: DC | PRN
  Administered 2016-11-01 (×2): 1 mg via INTRAVENOUS

## 2016-11-01 MED ORDER — SODIUM CHLORIDE 0.9% FLUSH: 3.0000 mL | INTRAVENOUS | Status: DC | PRN

## 2016-11-01 MED ORDER — OXYCODONE HCL 5 MG PO TABS: 5.0000 mg | ORAL_TABLET | Freq: Once | ORAL | Status: DC | PRN

## 2016-11-01 MED ORDER — FENTANYL CITRATE (PF) 100 MCG/2ML IJ SOLN
25.0000 ug | INTRAMUSCULAR | Status: DC | PRN
  Administered 2016-11-01: 50 ug via INTRAVENOUS
  Administered 2016-11-01 (×4): 25 ug via INTRAVENOUS

## 2016-11-01 MED ORDER — PROPOFOL 10 MG/ML IV BOLUS
INTRAVENOUS | Status: AC
  Filled 2016-11-01: qty 20

## 2016-11-01 MED ORDER — ACETAMINOPHEN 650 MG RE SUPP: 650.0000 mg | RECTAL | Status: DC | PRN

## 2016-11-01 MED ORDER — OXYCODONE HCL 5 MG/5ML PO SOLN: 5.0000 mg | Freq: Once | ORAL | Status: DC | PRN

## 2016-11-01 MED ORDER — ROCURONIUM BROMIDE 50 MG/5ML IV SOSY
PREFILLED_SYRINGE | INTRAVENOUS | Status: AC
  Filled 2016-11-01: qty 5

## 2016-11-01 MED ORDER — FENTANYL CITRATE (PF) 100 MCG/2ML IJ SOLN
INTRAMUSCULAR | Status: DC | PRN
  Administered 2016-11-01 (×2): 50 ug via INTRAVENOUS
  Administered 2016-11-01: 100 ug via INTRAVENOUS
  Administered 2016-11-01 (×2): 50 ug via INTRAVENOUS

## 2016-11-01 MED ORDER — HYDROCODONE-ACETAMINOPHEN 5-325 MG PO TABS: 1.0000 | ORAL_TABLET | Freq: Four times a day (QID) | ORAL | 0 refills | Status: DC | PRN

## 2016-11-01 MED ORDER — IOPAMIDOL (ISOVUE-300) INJECTION 61%
INTRAVENOUS | Status: AC
  Filled 2016-11-01: qty 50

## 2016-11-01 MED ORDER — SODIUM CHLORIDE 0.9 % IV SOLN: 250.0000 mL | INTRAVENOUS | Status: DC | PRN

## 2016-11-01 MED ORDER — ACETAMINOPHEN 325 MG PO TABS: 650.0000 mg | ORAL_TABLET | ORAL | Status: DC | PRN

## 2016-11-01 MED ORDER — BUPIVACAINE HCL 0.25 % IJ SOLN
INTRAMUSCULAR | Status: DC | PRN
  Administered 2016-11-01: 20 mL

## 2016-11-01 MED ORDER — MIDAZOLAM HCL 2 MG/2ML IJ SOLN
INTRAMUSCULAR | Status: AC
  Filled 2016-11-01: qty 2

## 2016-11-01 MED ORDER — FENTANYL CITRATE (PF) 100 MCG/2ML IJ SOLN: 25.0000 ug | INTRAMUSCULAR | Status: DC | PRN

## 2016-11-01 MED ORDER — ONDANSETRON HCL 4 MG/2ML IJ SOLN
INTRAMUSCULAR | Status: DC | PRN
  Administered 2016-11-01: 4 mg via INTRAVENOUS

## 2016-11-01 MED ORDER — DOCUSATE SODIUM 100 MG PO CAPS: 100.0000 mg | ORAL_CAPSULE | Freq: Two times a day (BID) | ORAL | 0 refills | Status: AC

## 2016-11-01 MED ORDER — LIDOCAINE 2% (20 MG/ML) 5 ML SYRINGE
INTRAMUSCULAR | Status: AC
  Filled 2016-11-01: qty 5

## 2016-11-01 MED ORDER — BUPIVACAINE HCL (PF) 0.25 % IJ SOLN
INTRAMUSCULAR | Status: AC
  Filled 2016-11-01: qty 30

## 2016-11-01 MED ORDER — ONDANSETRON HCL 4 MG/2ML IJ SOLN
INTRAMUSCULAR | Status: AC
  Filled 2016-11-01: qty 2

## 2016-11-01 MED ORDER — 0.9 % SODIUM CHLORIDE (POUR BTL) OPTIME
TOPICAL | Status: DC | PRN
  Administered 2016-11-01: 1000 mL

## 2016-11-01 MED ORDER — SODIUM CHLORIDE 0.9 % IR SOLN
Status: DC | PRN
  Administered 2016-11-01: 1

## 2016-11-01 MED ORDER — SODIUM CHLORIDE 0.9% FLUSH: 3.0000 mL | Freq: Two times a day (BID) | INTRAVENOUS | Status: DC

## 2016-11-01 MED ORDER — MIDAZOLAM HCL 2 MG/2ML IJ SOLN
2.0000 mg | Freq: Once | INTRAMUSCULAR | Status: AC
  Administered 2016-11-01: 2 mg via INTRAVENOUS

## 2016-11-01 MED ORDER — ROCURONIUM BROMIDE 100 MG/10ML IV SOLN
INTRAVENOUS | Status: DC | PRN
  Administered 2016-11-01: 50 mg via INTRAVENOUS
  Administered 2016-11-01: 10 mg via INTRAVENOUS

## 2016-11-01 MED ORDER — SUGAMMADEX SODIUM 500 MG/5ML IV SOLN
INTRAVENOUS | Status: DC | PRN
  Administered 2016-11-01: 600 mg via INTRAVENOUS

## 2016-11-01 MED ORDER — FENTANYL CITRATE (PF) 100 MCG/2ML IJ SOLN
INTRAMUSCULAR | Status: AC
  Filled 2016-11-01: qty 4

## 2016-11-01 MED ORDER — LACTATED RINGERS IV SOLN
INTRAVENOUS | Status: DC
  Administered 2016-11-01: 50 mL/h via INTRAVENOUS

## 2016-11-01 MED ORDER — OXYCODONE HCL 5 MG PO TABS
5.0000 mg | ORAL_TABLET | ORAL | Status: DC | PRN
  Administered 2016-11-01: 10 mg via ORAL

## 2016-11-01 MED ORDER — LIDOCAINE HCL (CARDIAC) 20 MG/ML IV SOLN
INTRAVENOUS | Status: DC | PRN
  Administered 2016-11-01: 60 mg via INTRAVENOUS

## 2016-11-01 MED ORDER — CEFAZOLIN SODIUM-DEXTROSE 2-4 GM/100ML-% IV SOLN
2.0000 g | INTRAVENOUS | Status: AC
  Administered 2016-11-01: 2 g via INTRAVENOUS
  Filled 2016-11-01: qty 100

## 2016-11-01 MED ORDER — PROPOFOL 10 MG/ML IV BOLUS
INTRAVENOUS | Status: DC | PRN
  Administered 2016-11-01: 180 mg via INTRAVENOUS

## 2016-11-01 MED ORDER — OXYCODONE HCL 5 MG PO TABS
ORAL_TABLET | ORAL | Status: AC
  Filled 2016-11-01: qty 2

## 2016-11-01 MED FILL — HYDROCODON-APAP 5-325: 5-325 | 8 days supply | Qty: 30 | Fill #0

## 2016-11-01 SURGICAL SUPPLY — 37 items
APPLIER CLIP 5 13 M/L LIGAMAX5 (MISCELLANEOUS) ×2
BLADE SURG ROTATE 9660 (MISCELLANEOUS) IMPLANT
CANISTER SUCTION 2500CC (MISCELLANEOUS) ×2 IMPLANT
CHLORAPREP W/TINT 26ML (MISCELLANEOUS) ×2 IMPLANT
CLIP APPLIE 5 13 M/L LIGAMAX5 (MISCELLANEOUS) ×1 IMPLANT
COVER SURGICAL LIGHT HANDLE (MISCELLANEOUS) ×2 IMPLANT
DERMABOND ADVANCED (GAUZE/BANDAGES/DRESSINGS) ×1
DERMABOND ADVANCED .7 DNX12 (GAUZE/BANDAGES/DRESSINGS) ×1 IMPLANT
ELECT REM PT RETURN 9FT ADLT (ELECTROSURGICAL) ×2
ELECTRODE REM PT RTRN 9FT ADLT (ELECTROSURGICAL) ×1 IMPLANT
GLOVE BIO SURGEON STRL SZ 6 (GLOVE) IMPLANT
GLOVE BIOGEL PI IND STRL 6.5 (GLOVE) ×1 IMPLANT
GLOVE BIOGEL PI IND STRL 7.0 (GLOVE) ×1 IMPLANT
GLOVE BIOGEL PI INDICATOR 6.5 (GLOVE) ×1
GLOVE BIOGEL PI INDICATOR 7.0 (GLOVE) ×1
GLOVE SURG SS PI 6.0 STRL IVOR (GLOVE) ×2 IMPLANT
GLOVE SURG SS PI 6.5 STRL IVOR (GLOVE) ×4 IMPLANT
GOWN STRL REUS W/ TWL LRG LVL3 (GOWN DISPOSABLE) ×3 IMPLANT
GOWN STRL REUS W/TWL LRG LVL3 (GOWN DISPOSABLE) ×3
GRASPER SUT TROCAR 14GX15 (MISCELLANEOUS) ×2 IMPLANT
KIT BASIN OR (CUSTOM PROCEDURE TRAY) ×2 IMPLANT
KIT ROOM TURNOVER OR (KITS) ×2 IMPLANT
NEEDLE INSUFFLATION 14GA 120MM (NEEDLE) ×2 IMPLANT
NS IRRIG 1000ML POUR BTL (IV SOLUTION) ×2 IMPLANT
PAD ARMBOARD 7.5X6 YLW CONV (MISCELLANEOUS) ×2 IMPLANT
POUCH SPECIMEN RETRIEVAL 10MM (ENDOMECHANICALS) ×2 IMPLANT
SCISSORS LAP 5X35 DISP (ENDOMECHANICALS) ×2 IMPLANT
SET IRRIG TUBING LAPAROSCOPIC (IRRIGATION / IRRIGATOR) ×2 IMPLANT
SLEEVE ENDOPATH XCEL 5M (ENDOMECHANICALS) ×4 IMPLANT
SPECIMEN JAR SMALL (MISCELLANEOUS) ×2 IMPLANT
SUT MNCRL AB 4-0 PS2 18 (SUTURE) ×2 IMPLANT
TOWEL OR 17X24 6PK STRL BLUE (TOWEL DISPOSABLE) ×2 IMPLANT
TOWEL OR 17X26 10 PK STRL BLUE (TOWEL DISPOSABLE) IMPLANT
TRAY LAPAROSCOPIC MC (CUSTOM PROCEDURE TRAY) ×2 IMPLANT
TROCAR XCEL NON-BLD 11X100MML (ENDOMECHANICALS) ×2 IMPLANT
TROCAR XCEL NON-BLD 5MMX100MML (ENDOMECHANICALS) ×2 IMPLANT
TUBING INSUFFLATION (TUBING) ×2 IMPLANT

## 2016-11-01 NOTE — H&P (Signed)
Katrina Larson Patient #: [id number] DOB: [birth year] Married / Language: English / Race: White Female   History of Present Illness Patient words: This very pleasant 36 year old woman who has had several kinds of abdominal symptoms for the last 6 months. She describes a pain that starts in her right upper quadrant and radiates to her left lower quadrant. [name] describes postprandial indigestion nausea and occasional emesis. She has a long-standing history of GERD type symptoms and states that she eats about a bottle of times every month. Her primary care doctor has just put her on Protonix which she states did give her some improvement in indigestion, but she continues to have postprandial nausea and pain. Additionally about 3 weeks ago she was having lower abdominal pain which was crampy in nature, and one episode of bright red blood per rectum which was self resolving. She has undergone an ultrasound which revealed multiple small gallstones and gallbladder wall of 4 mm with a common bile duct diameter 3 mm, as well as fatty infiltration of the liver. She then underwent a CT scan of her abdomen and pelvis which was negative except for perirectal stranding and rectal wall haziness of unknown significance. She does not have any family history of irritable bowel disease or autoimmune disease that she is aware of.  Other Problems Gastroesophageal Reflux Disease  Past Surgical History  Appendectomy Knee Surgery Right. Oral Surgery Tonsillectomy  Diagnostic Studies History Colonoscopy never Mammogram never Pap Smear 1-5 years ago  Allergies Latex  Medication History Cyanocobalamin (1000MCG/ML Solution, Injection) Active. TraMADol HCl (50MG  Tablet, Oral) Active. Thiamine HCl (250MG  Tablet, Oral) Active. (Vitamin B-1) Multivitamins/Minerals (Oral) Active. Vitamin D (Cholecalciferol) (1000UNIT Tablet, Oral) Active. Medications Reconciled  Social History Alcohol  use Occasional alcohol use. Caffeine use Carbonated beverages, Tea. No drug use Tobacco use Never smoker.  Family Histor Alcohol Abuse Father. Migraine Headache Mother. Thyroid problems Mother.  Pregnancy / Birth History  Age at menarche 85 years. Contraceptive History Intrauterine device, Oral contraceptives. Gravida 1 Irregular periods Length (months) of breastfeeding 12-24 Maternal age 68-30 Para 1    Review of Systems  General Present- Fatigue. Not Present- Appetite Loss, Chills, Fever, Night Sweats, Weight Gain and Weight Loss. Skin Present- Dryness. Not Present- Change in Wart/Mole, Hives, Jaundice, New Lesions, Non-Healing Wounds, Rash and Ulcer. HEENT Present- Seasonal Allergies. Not Present- Earache, Hearing Loss, Hoarseness, Nose Bleed, Oral Ulcers, Ringing in the Ears, Sinus Pain, Sore Throat, Visual Disturbances, Wears glasses/contact lenses and Yellow Eyes. Respiratory Not Present- Bloody sputum, Chronic Cough, Difficulty Breathing, Snoring and Wheezing. Breast Not Present- Breast Mass, Breast Pain, Nipple Discharge and Skin Changes. Gastrointestinal Present- Abdominal Pain, Bloating, Change in Bowel Habits, Excessive gas, Gets full quickly at meals, Indigestion and Nausea. Not Present- Bloody Stool, Chronic diarrhea, Constipation, Difficulty Swallowing, Hemorrhoids, Rectal Pain and Vomiting. Psychiatric Present- Change in Sleep Pattern. Not Present- Anxiety, Bipolar, Depression, Fearful and Frequent crying. Endocrine Present- Hair Changes. Not Present- Cold Intolerance, Excessive Hunger, Heat Intolerance, Hot flashes and New Diabetes. Hematology Not Present- Blood Thinners, Easy Bruising, Excessive bleeding, Gland problems, HIV and Persistent Infections.  Vitals  09/27/2016 2:10 PM Weight: 253.25 lb Height: 65in Body Surface Area: 2.19 m Body Mass Index: 42.14 kg/m  Temp.: 98.8F  Pulse: 74 (Regular)  P.OX: 97% (Room air) BP: 144/82  (Sitting, Left Arm, Standard)       Physical Exam  General Note: Alert and oriented no distress   Integumentary Note: No rashes or lesions   Head and Neck Note: Neck  without mass or thyromegaly   Eye Note: Anicteric, extraocular motion intact   ENMT Note: Moist mucous membranes, good dentition   Chest and Lung Exam Note: Unlabored respirations, clear bilaterally   Cardiovascular Note: Regular rate and rhythm, palpable distal pulses   Abdomen Note: Obese, soft, nontender. Prior appendectomy.no palpable hernia mass or organomegaly   Neurologic Note: Grossly intact, normal gait   Neuropsychiatric Note: Normal mood and affect and appropriate insight   Musculoskeletal Note: No asymmetry or deformity, strength is even throughout     Assessment & Plan  BILIARY COLIC (Principal Diagnosis) (K80.50) Story: She has long-standing history of right upper quadrant pain and postprandial discomfort with nausea. I recommended that it would be reasonable to proceed with laparoscopic cholecystectomy, but I do not know if it will help with the chronic GERD and it will not address the lower abdominal pain she is experiencing. We discussed the nature of the surgery, the risks involved including bleeding, infection, pain, scarring, injury to adjacent structures particularly the common bile duct and sequelae. She understands and is agreeable to proceed  Her mother also has chronic long-standing indigestion status post cholecystectomy. She does not have anybody in her family's been tested for H. pylori. I recommended that she be seen by gastroenterologist for upper and lower endoscopy to assess for ulcer disease/test for H. pylori as well as for a lower endoscopy to further evaluate the inflammatory findings noted on her CT scan a few days ago.

## 2016-11-01 NOTE — Anesthesia Preprocedure Evaluation (Signed)

## 2016-11-01 NOTE — Anesthesia Procedure Notes (Signed)
Procedure Name: Intubation Date/Time: 11/01/2016 12:21 PM Performed by: Neldon Newport Pre-anesthesia Checklist: Timeout performed, Patient being monitored, Suction available, Emergency Drugs available and Patient identified Patient Re-evaluated:Patient Re-evaluated prior to inductionOxygen Delivery Method: Circle system utilized Preoxygenation: Pre-oxygenation with 100% oxygen Intubation Type: IV induction Ventilation: Mask ventilation without difficulty and Oral airway inserted - appropriate to patient size Laryngoscope Size: Mac and 3 Grade View: Grade II Tube type: Oral Tube size: 7.0 mm Number of attempts: 2 Placement Confirmation: breath sounds checked- equal and bilateral,  positive ETCO2 and ETT inserted through vocal cords under direct vision Secured at: 22 cm Tube secured with: Tape Dental Injury: Teeth and Oropharynx as per pre-operative assessment

## 2016-11-01 NOTE — Anesthesia Postprocedure Evaluation (Signed)
Anesthesia Post Note  Patient: Katrina Larson  Procedure(s) Performed: Procedure(s) (LRB): LAPAROSCOPIC CHOLECYSTECTOMY (N/A)  Patient location during evaluation: PACU Anesthesia Type: General Level of consciousness: awake and alert and patient cooperative Pain management: pain level controlled Vital Signs Assessment: post-procedure vital signs reviewed and stable Respiratory status: spontaneous breathing and respiratory function stable Cardiovascular status: stable Anesthetic complications: no       Last Vitals:  Vitals:   11/01/16 1413 11/01/16 1420  BP: 119/84 123/81  Pulse: 73 89  Resp: 20 16  Temp: 36.4 C     Last Pain:  Vitals:   11/01/16 1413  TempSrc:   PainSc: [place]

## 2016-11-01 NOTE — Op Note (Signed)
Operative Note  Katrina Larson 36 y.o. female [id number]  11/01/2016  Surgeon: Clovis Riley   Assistant: none  Procedure performed: Laparoscopic Cholecystectomy  Preop diagnosis: chronic cholecystitis Post-op diagnosis/intraop findings: same  Specimens: gallbladder  EBL: [id number]  Complications: none  Description of procedure: After obtaining informed consent the patient was brought to the operating room. Prophylactic antibiotics and subcutaneous heparin were administered. SCD's were applied. General endotracheal anesthesia was initiated and a formal time-out was performed. The abdomen was prepped and draped in the usual sterile fashion and the abdomen was entered using an infraumbilical veress needle after instilling the site with local. Insufflation to 64mmHg was obtained, 3mm trocar placed, and gross inspection revealed no evidence of injury from our entry or other intraabdominal abnormalities. Two 3mm trocars were introduced in the right midclavicular and right anterior axillary lines under direct visualization and following infiltration with local. A 55mm trocar was placed in the epigastrium. The gallbladder was encased in omental adhesions which were dissected with cautery and blunt maneuvers. The gallbladder was then retracted cephalad and the infundibulum was retracted laterally. A combination of hook electrocautery and blunt dissection was utilized to clear the peritoneum from the neck and cystic duct, circumferentially isolating the cystic artery and cystic duct and lifting the gallbladder from the cystic plate. The cystic duct was small and fibrotic. The critical view of safety was achieved with the cystic artery, cystic duct, and liver bed visualized between them with no other structures. The artery was clipped with a single clip proximally and distally and divided as was the cystic duct with two clips on the proximal end. The gallbladder was dissected from the liver plate using  electrocautery. Once freed the gallbladder was placed in an endocatch bag and removed through the epigastric trocar site. Some bile had been spilled from the gallbladder during its dissection from the liver bed. This was aspirated and the right upper quadrant was irrigated copiously until the effluent was clear. Hemostasis was once again confirmed, and reinspection of the abdomen revealed no injuries. The clips were well opposed without any bile leak from the duct or the liver bed. The 36mm trocar site in the epigastrium was closed with a 0 vicryl in the fascia under direct visualization using a PMI device. The abdomen was desufflated and all trocars removed. The skin incisions were closed with running subcuticular monocryl and Dermabond. The patient was awakened, extubated and transported to the recovery room in stable condition.   All counts were correct at the completion of the case.

## 2016-11-01 NOTE — Discharge Instructions (Signed)
[facility] ______[facility] °LAPAROSCOPIC SURGERY: POST OP INSTRUCTIONS °Always review your discharge instruction sheet given to you by the facility where your surgery was performed. °IF YOU HAVE DISABILITY OR FAMILY LEAVE FORMS, YOU MUST BRING THEM TO THE OFFICE FOR PROCESSING.   °DO NOT GIVE THEM TO YOUR DOCTOR. ° °1. A prescription for pain medication may be given to you upon discharge.  Take your pain medication as prescribed, if needed.  If narcotic pain medicine is not needed, then you may take acetaminophen (Tylenol) or ibuprofen (Advil) as needed. °2. Take your usually prescribed medications unless otherwise directed. °3. If you need a refill on your pain medication, please contact your pharmacy.  They will contact our office to request authorization. Prescriptions will not be filled after 5pm or on week-ends. °4. You should follow a light diet the first few days after arrival home, such as soup and crackers, etc.  Be sure to include lots of fluids daily. °5. Most patients will experience some swelling and bruising in the area of the incisions.  Ice packs will help.  Swelling and bruising can take several days to resolve.  °6. It is common to experience some constipation if taking pain medication after surgery.  Increasing fluid intake and taking a stool softener (such as Colace) will usually help or prevent this problem from occurring.  A mild laxative (Milk of Magnesia or Miralax) should be taken according to package instructions if there are no bowel movements after 48 hours. °7. Unless discharge instructions indicate otherwise, you may remove your bandages 24-48 hours after surgery, and you may shower at that time.  You may have steri-strips (small skin tapes) in place directly over the incision.  These strips should be left on the skin for 7-10 days.  If your surgeon used skin glue on the incision, you may shower in 24 hours.  The glue will flake off over the next 2-3 weeks.  Any sutures or  staples will be removed at the office during your follow-up visit. °8. ACTIVITIES:  You may resume regular (light) daily activities beginning the next day--such as daily self-care, walking, climbing stairs--gradually increasing activities as tolerated.  You may have sexual intercourse when it is comfortable.  Refrain from any heavy lifting or straining until approved by your doctor. °a. You may drive when you are no longer taking prescription pain medication, you can comfortably wear a seatbelt, and you can safely maneuver your car and apply brakes. °b. RETURN TO WORK:  __________________________________________________________ °9. You should see your doctor in the office for a follow-up appointment approximately 2-3 weeks after your surgery.  Make sure that you call for this appointment within a day or two after you arrive home to insure a convenient appointment time. °10. OTHER INSTRUCTIONS: __________________________________________________________________________________________________________________________ __________________________________________________________________________________________________________________________ °WHEN TO CALL YOUR DOCTOR: °1. Fever over 101.0 °2. Inability to urinate °3. Continued bleeding from incision. °4. Increased pain, redness, or drainage from the incision. °5. Increasing abdominal pain ° °The clinic staff is available to answer your questions during regular business hours.  Please don’t hesitate to call and ask to speak to one of the nurses for clinical concerns.  If you have a medical emergency, go to the nearest emergency room or call 911.  A surgeon from [facility] is always on call at the hospital. °[address], [address], [place], [place]  [address] ? [address] [address], [place], [place]   [address] °[phone] ? [phone] ? FAX [phone] °Web site:   [email or web address] °

## 2016-11-01 NOTE — Transfer of Care (Signed)
Immediate Anesthesia Transfer of Care Note  Patient: Katrina Larson  Procedure(s) Performed: Procedure(s): LAPAROSCOPIC CHOLECYSTECTOMY (N/A)  Patient Location: PACU  Anesthesia Type:General  Level of Consciousness: awake, alert  and oriented  Airway & Oxygen Therapy: Patient Spontanous Breathing and Patient connected to face mask oxygen  Post-op Assessment: Report given to RN, Post -op Vital signs reviewed and stable and Patient moving all extremities X 4  Post vital signs: Reviewed and stable  Last Vitals:  Vitals:   11/01/16 1025 11/01/16 1330  BP: 126/86   Pulse: 78   Resp: 18 (P) 15  Temp: 36.7 C     Last Pain:  Vitals:   11/01/16 1056  TempSrc:   PainSc: 5       Patients Stated Pain Goal: 3 ([id number])  Complications: No apparent anesthesia complications

## 2016-11-02 ENCOUNTER — Encounter (HOSPITAL_COMMUNITY): Payer: Self-pay | Admitting: Surgery

## 2016-11-05 ENCOUNTER — Encounter: Payer: Self-pay | Admitting: Internal Medicine

## 2017-01-22 DIAGNOSIS — H5213 Myopia, bilateral: Secondary | ICD-10-CM | POA: Diagnosis not present

## 2017-01-22 DIAGNOSIS — H52223 Regular astigmatism, bilateral: Secondary | ICD-10-CM | POA: Diagnosis not present

## 2017-09-17 DIAGNOSIS — E282 Polycystic ovarian syndrome: Secondary | ICD-10-CM | POA: Diagnosis not present

## 2017-09-17 DIAGNOSIS — D649 Anemia, unspecified: Secondary | ICD-10-CM | POA: Diagnosis not present

## 2017-09-17 DIAGNOSIS — Z6841 Body Mass Index (BMI) 40.0 and over, adult: Secondary | ICD-10-CM | POA: Diagnosis not present

## 2017-09-17 DIAGNOSIS — Z01419 Encounter for gynecological examination (general) (routine) without abnormal findings: Secondary | ICD-10-CM | POA: Diagnosis not present

## 2017-09-17 MED FILL — MEDROXYPROGESTERONE 10 MG T: 10 | 10 days supply | Qty: 10 | Fill #0

## 2017-09-17 MED FILL — LETROZOLE 2.5 MG TABLET: 2.5 | 5 days supply | Qty: 5 | Fill #0

## 2017-09-24 LAB — CBC AND DIFFERENTIAL
HCT: 32 — AB (ref 36–46)
HEMOGLOBIN: 9.9 — AB (ref 12.0–16.0)
Platelets: 394 (ref 150–399)
WBC: 9.3

## 2017-11-03 IMAGING — US US ABDOMEN LIMITED
1 series · 15 of 25 positions shown · non-contrast
Comparison: 08/09/2010

CLINICAL DATA: Right upper quadrant abdominal pain

EXAM:
US ABDOMEN LIMITED - RIGHT UPPER QUADRANT

[Series 1: us abdomen limited · 15 of 39 slices shown]
[im 1/39]
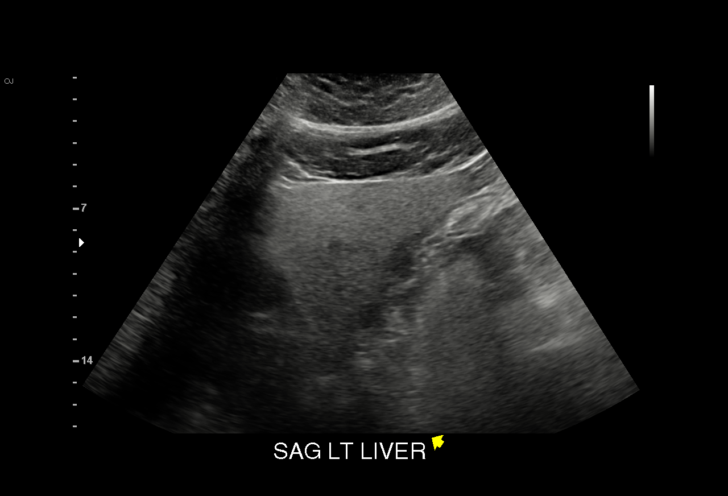
[im 4/39]
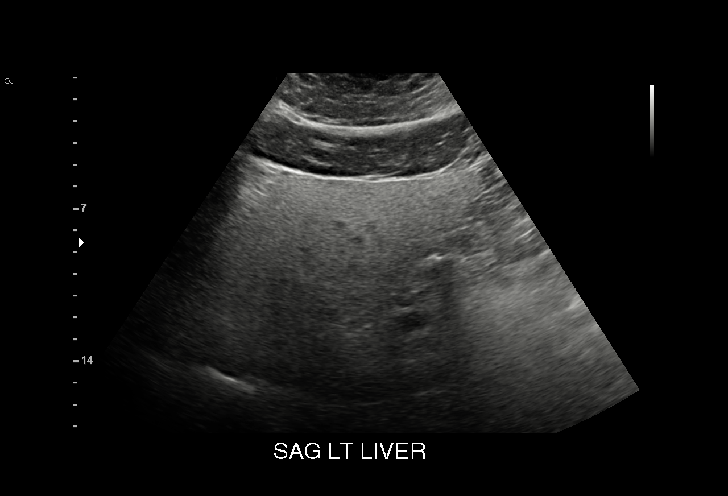
[im 7/39]
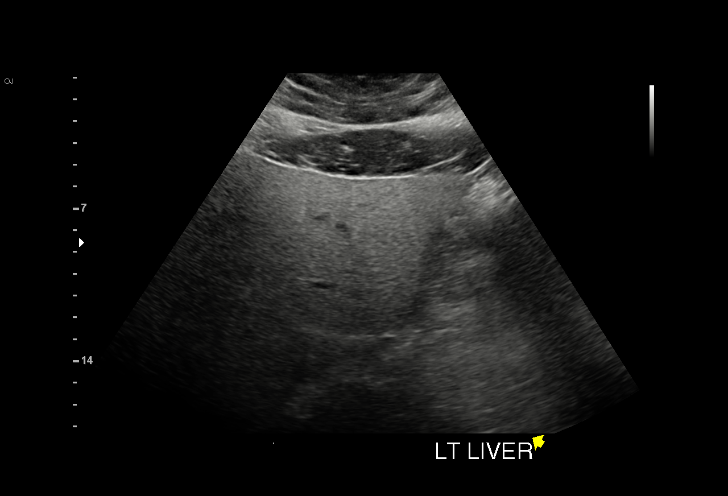
[im 8/39]
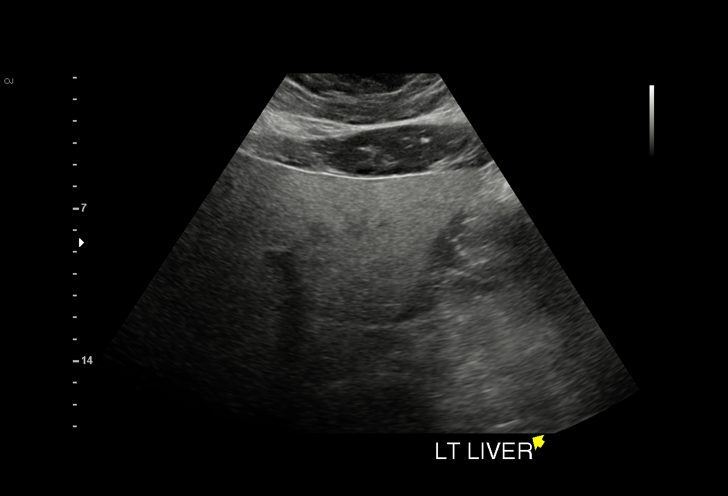
[im 12/39]
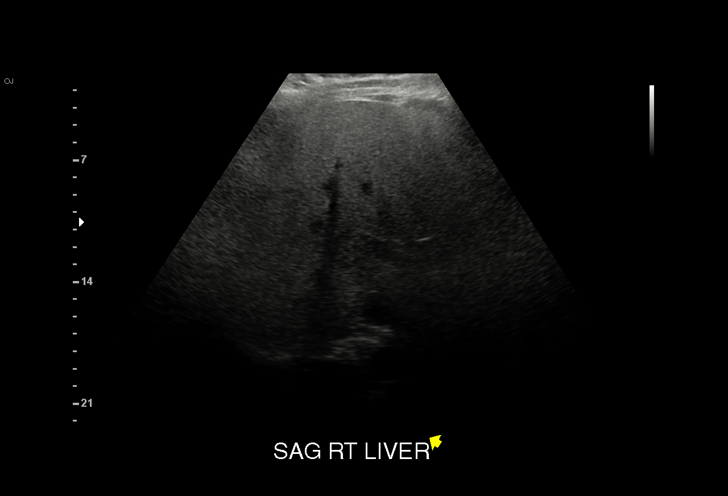
[im 15/39]
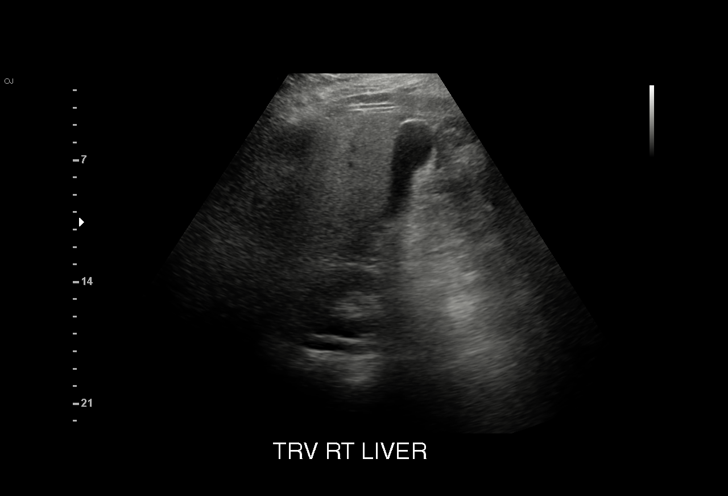
[im 16/39]
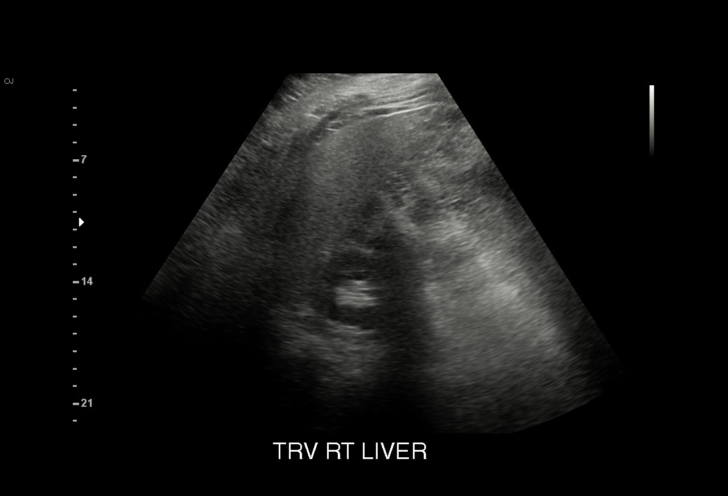
[im 20/39]
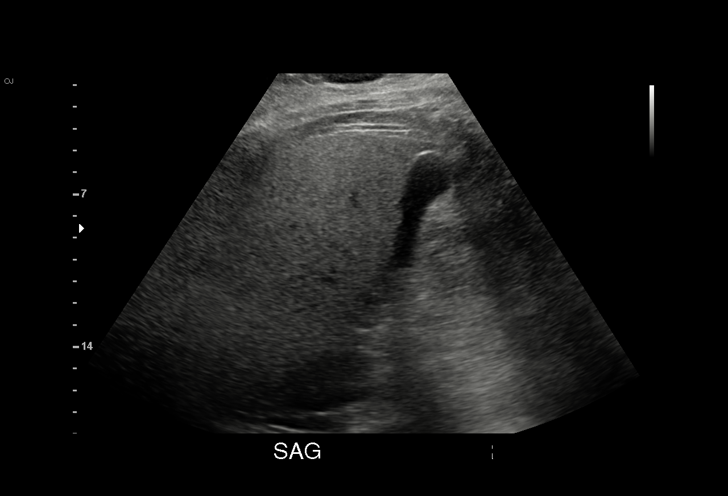
[im 23/39]
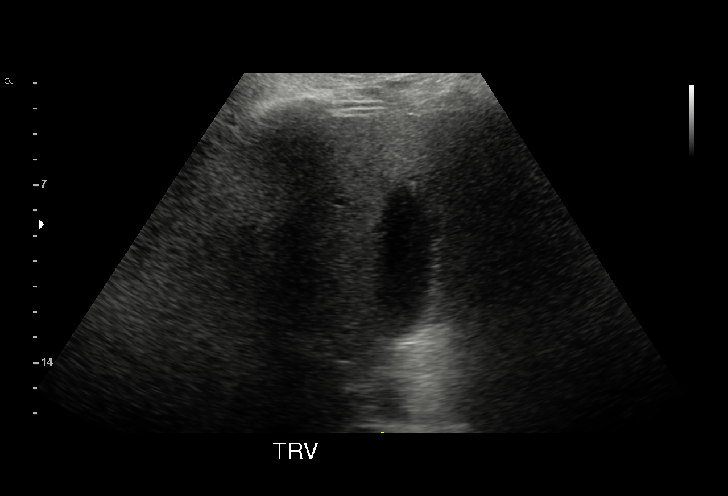
[im 24/39]
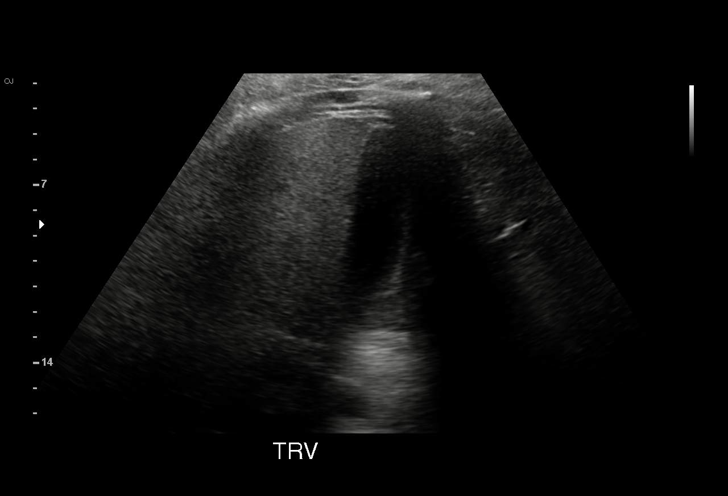
[im 27/39]
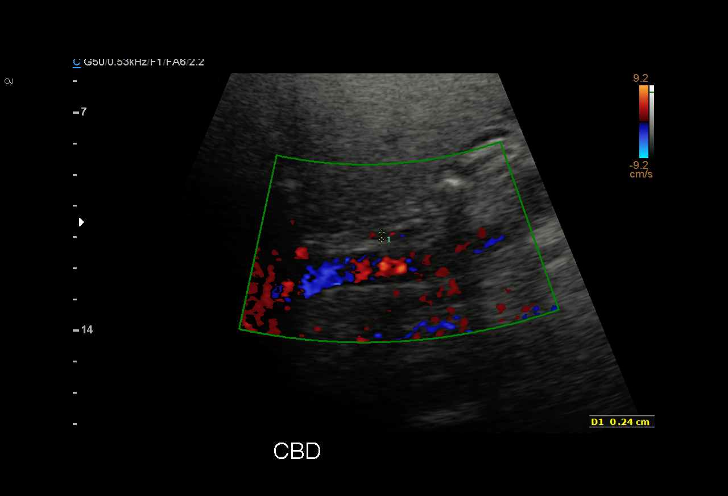
[im 31/39]
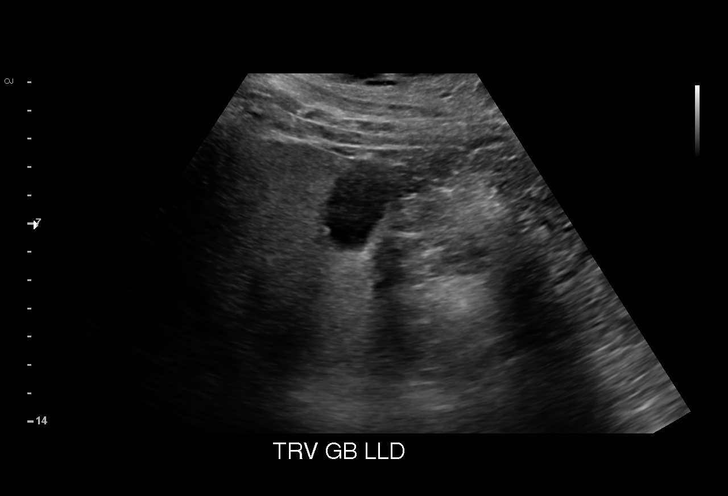
[im 32/39]
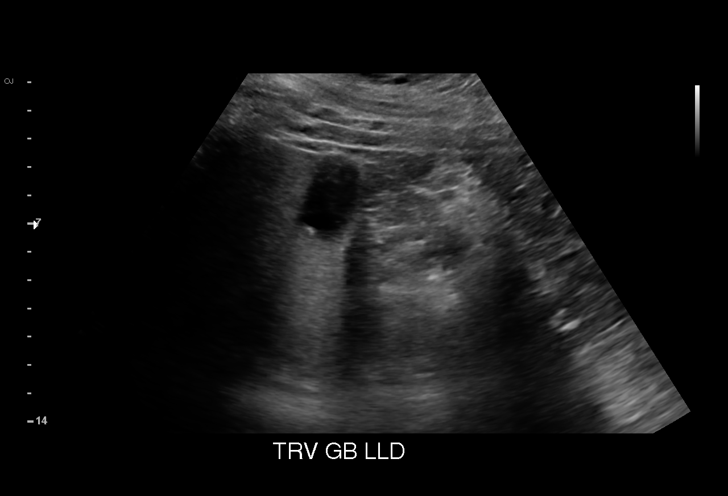
[im 35/39]
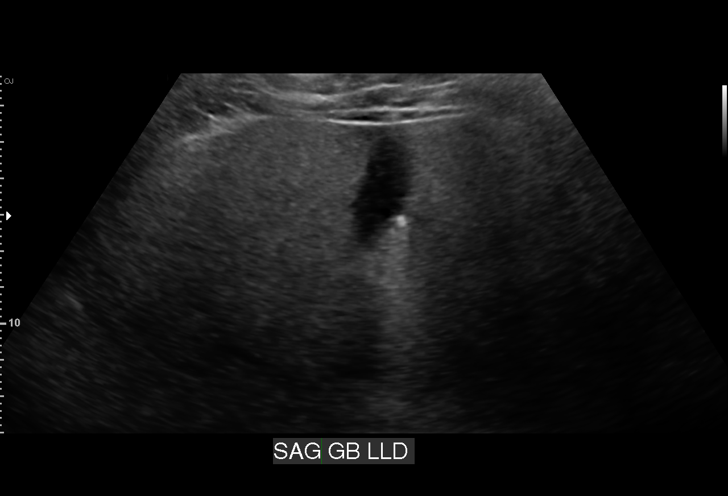
[im 39/39]
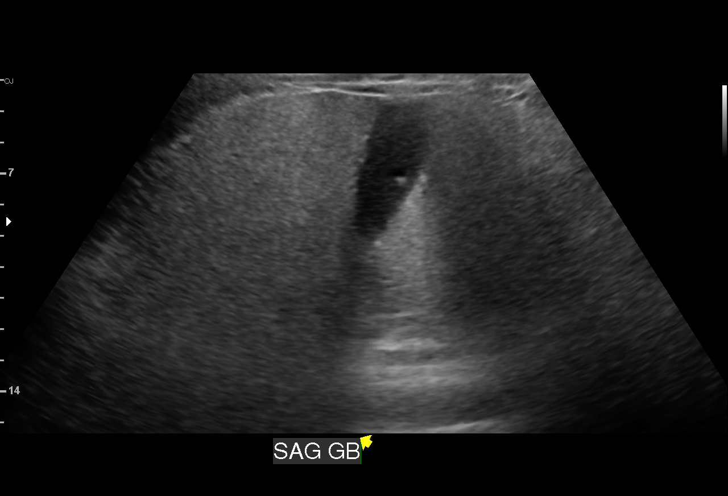

[15 of 25 positions shown; findings below may reference images not displayed]

FINDINGS: Gallbladder:

There are a few small mobile gallstones, the largest measuring 8 mm.
Gallbladder wall mildly thickened at 4 mm. No sonographic Murphy's
sign.

Common bile duct:

Diameter: Normal caliber, 3 mm

Liver:

Diffusely increased echotexture compatible with fatty infiltration.
No focal abnormality or biliary ductal dilatation.
IMPRESSION: Cholelithiasis. Mild gallbladder wall thickening may reflect chronic
cholecystitis. No sonographic Murphy sign to suggest acute
cholecystitis. Recommend clinical correlation.

## 2018-01-02 ENCOUNTER — Other Ambulatory Visit (INDEPENDENT_AMBULATORY_CARE_PROVIDER_SITE_OTHER): Payer: 59

## 2018-01-02 ENCOUNTER — Encounter: Payer: Self-pay | Admitting: Internal Medicine

## 2018-01-02 ENCOUNTER — Ambulatory Visit (INDEPENDENT_AMBULATORY_CARE_PROVIDER_SITE_OTHER): Payer: 59 | Admitting: Internal Medicine

## 2018-01-02 VITALS — BP 122/88 | HR 86 | Temp 97.8°F | Ht 65.0 in | Wt 254.0 lb

## 2018-01-02 DIAGNOSIS — D649 Anemia, unspecified: Secondary | ICD-10-CM | POA: Diagnosis not present

## 2018-01-02 DIAGNOSIS — R5383 Other fatigue: Secondary | ICD-10-CM

## 2018-01-02 LAB — LIPID PANEL
Cholesterol: 181 mg/dL (ref 0–200)
HDL: 42.8 mg/dL (ref >39.00)
LDL Cholesterol: 113 mg/dL — ABNORMAL HIGH (ref 0–99)
NONHDL: 138.17
Total CHOL/HDL Ratio: 4
Triglycerides: 127 mg/dL (ref 0.0–149.0)
VLDL: 25.4 mg/dL (ref 0.0–40.0)

## 2018-01-02 LAB — COMPREHENSIVE METABOLIC PANEL
ALK PHOS: 117 U/L (ref 39–117)
ALT: 13 U/L (ref 0–35)
AST: 14 U/L (ref 0–37)
Albumin: 4.2 g/dL (ref 3.5–5.2)
BILIRUBIN TOTAL: 0.3 mg/dL (ref 0.2–1.2)
BUN: 11 mg/dL (ref 6–23)
CO2: 25 mEq/L (ref 19–32)
Calcium: 9.5 mg/dL (ref 8.4–10.5)
Chloride: 104 mEq/L (ref 96–112)
Creatinine, Ser: 0.77 mg/dL (ref 0.40–1.20)
GFR: 89.53 mL/min (ref >60.00)
GLUCOSE: 87 mg/dL (ref 70–99)
Potassium: 3.8 mEq/L (ref 3.5–5.1)
Sodium: 139 mEq/L (ref 135–145)
TOTAL PROTEIN: 8.2 g/dL (ref 6.0–8.3)

## 2018-01-02 LAB — FERRITIN: Ferritin: 21.5 ng/mL (ref 10.0–291.0)

## 2018-01-02 LAB — VITAMIN B12: VITAMIN B 12: 258 pg/mL (ref 211–911)

## 2018-01-02 LAB — CBC
HCT: 34.3 % — ABNORMAL LOW (ref 36.0–46.0)
HEMOGLOBIN: 10.9 g/dL — AB (ref 12.0–15.0)
MCHC: 31.7 g/dL (ref 30.0–36.0)
MCV: 72.9 fl — ABNORMAL LOW (ref 78.0–100.0)
PLATELETS: 351 10*3/uL (ref 150.0–400.0)
RBC: 4.71 Mil/uL (ref 3.87–5.11)
RDW: 17.6 % — AB (ref 11.5–15.5)
WBC: 8.6 10*3/uL (ref 4.0–10.5)

## 2018-01-02 LAB — VITAMIN D 25 HYDROXY (VIT D DEFICIENCY, FRACTURES): VITD: 23.73 ng/mL — AB (ref 30.00–100.00)

## 2018-01-02 LAB — TSH: TSH: 3.65 u[IU]/mL (ref 0.35–4.50)

## 2018-01-02 LAB — HEMOGLOBIN A1C: HEMOGLOBIN A1C: 6.1 % (ref 4.6–6.5)

## 2018-01-02 NOTE — Assessment & Plan Note (Signed)
Unknown cause given she is not having excessive menstrual cycles or blood loss GI. Colonoscopy normal with 1 polyp 2017.

## 2018-01-02 NOTE — Progress Notes (Signed)
   Subjective:    Patient ID: Katrina Larson, female    DOB: 1981-08-12, 37 y.o.   MRN: [id number]  HPI The patient is a 37 YO female coming in for fatigue. She was told that she had iron deficiency in November by gyn and started taking iron. Does not have menstrual cycles most months. Denies rectal bleeding or known blood loss. She is tired all the time and sleeping up to 4 hour nap per day. Denies feeling refreshed with sleeping. Sleeps without snoring and was tested for OSA without having that about 5-10 years ago. Does have some headaches. Uses excessive caffeine to help. Has cut that back lately.   Review of Systems  Constitutional: Positive for activity change and fatigue. Negative for appetite change, chills, diaphoresis, fever and unexpected weight change.  Respiratory: Negative for cough, chest tightness and shortness of breath.   Cardiovascular: Negative for chest pain, palpitations and leg swelling.  Gastrointestinal: Negative for abdominal distention, abdominal pain, constipation, diarrhea, nausea and vomiting.  Genitourinary: Negative for vaginal bleeding.  Musculoskeletal: Negative.   Skin: Negative.   Neurological: Negative.   Psychiatric/Behavioral: Negative.       Objective:   Physical Exam  Constitutional: She is oriented to person, place, and time. She appears well-developed and well-nourished.  Overweight  HENT:  Head: Normocephalic and atraumatic.  Eyes: EOM are normal.  Neck: Normal range of motion.  Cardiovascular: Normal rate and regular rhythm.  Pulmonary/Chest: Effort normal and breath sounds normal. No respiratory distress. She has no wheezes. She has no rales.  Abdominal: Soft. Bowel sounds are normal. She exhibits no distension. There is no tenderness. There is no rebound.  Musculoskeletal: She exhibits no edema.  Neurological: She is alert and oriented to person, place, and time. Coordination normal.  Skin: Skin is warm and dry.  Psychiatric: She  has a normal mood and affect.   Vitals:   01/02/18 1102  BP: 122/88  Pulse: 86  Temp: 97.8 F (36.6 C)  TempSrc: Oral  SpO2: 98%  Weight: 254 lb (115.2 kg)  Height: 5\' 5"  (1.651 m)      Assessment & Plan:

## 2018-01-02 NOTE — Assessment & Plan Note (Signed)
Checking labs today for CBC, ferritin, B12, thyroid, CMP, lipid, HgA1c. If no cause will refer to sleep medicine for evaluation of her abnormal sleeping.

## 2018-01-07 ENCOUNTER — Encounter: Payer: Self-pay | Admitting: Internal Medicine

## 2018-01-07 DIAGNOSIS — G4719 Other hypersomnia: Secondary | ICD-10-CM

## 2018-01-28 DIAGNOSIS — H5213 Myopia, bilateral: Secondary | ICD-10-CM | POA: Diagnosis not present

## 2018-01-28 DIAGNOSIS — H524 Presbyopia: Secondary | ICD-10-CM | POA: Diagnosis not present

## 2018-01-28 DIAGNOSIS — H52223 Regular astigmatism, bilateral: Secondary | ICD-10-CM | POA: Diagnosis not present

## 2018-02-06 ENCOUNTER — Ambulatory Visit: Payer: Self-pay

## 2018-02-06 ENCOUNTER — Ambulatory Visit: Payer: 59 | Admitting: Internal Medicine

## 2018-02-06 ENCOUNTER — Encounter (HOSPITAL_COMMUNITY): Payer: Self-pay | Admitting: Emergency Medicine

## 2018-02-06 ENCOUNTER — Ambulatory Visit (HOSPITAL_COMMUNITY)
Admission: EM | Admit: 2018-02-06 | Discharge: 2018-02-06 | Disposition: A | Discharge status: Home / Self Care | Payer: 59

## 2018-02-06 DIAGNOSIS — R0981 Nasal congestion: Secondary | ICD-10-CM

## 2018-02-06 DIAGNOSIS — J3489 Other specified disorders of nose and nasal sinuses: Secondary | ICD-10-CM

## 2018-02-06 MED ORDER — LORATADINE-PSEUDOEPHEDRINE ER 10-240 MG PO TB24: 1.0000 | ORAL_TABLET | Freq: Every day | ORAL | 1 refills | Status: DC

## 2018-02-06 MED ORDER — PREDNISONE 50 MG PO TABS: 50.0000 mg | ORAL_TABLET | Freq: Every day | ORAL | 0 refills | Status: AC

## 2018-02-06 MED ORDER — AMOXICILLIN-POT CLAVULANATE 875-125 MG PO TABS: 1.0000 | ORAL_TABLET | Freq: Two times a day (BID) | ORAL | 0 refills | Status: DC

## 2018-02-06 MED ORDER — IPRATROPIUM BROMIDE 0.06 % NA SOLN: 2.0000 | Freq: Four times a day (QID) | NASAL | 0 refills | Status: DC

## 2018-02-06 MED FILL — IPRATROPIUM 0.06% SPRAY: 0.06 | 21 days supply | Qty: 15 | Fill #0

## 2018-02-06 MED FILL — predniSONE 50 MG TABS: 50 | 5 days supply | Qty: 5 | Fill #0

## 2018-02-06 NOTE — Telephone Encounter (Signed)
Noted patient at Urgent care

## 2018-02-06 NOTE — ED Provider Notes (Signed)
[place]    CSN: [id number] Arrival date & time: 02/06/18  1124     History   Chief Complaint Chief Complaint  Patient presents with  . URI    HPI Katrina Larson is a 37 y.o. female.   37 year old female comes in for 1 week history of  URI symptoms.  Has had mild cough, sinus pressure, nasal congestion, rhinorrhea, heavy breathing.  States heavy breathing described as feeling labored, and not able to take deep breaths. States the symptoms only occur when she is outside and thinks may also be allergy related. Denies shortness of breath, chest pain.  States she felt like she had intermittent wheezing.  She has been taking Claritin-D with good relief, however given can only get 2-week  medication at a time, has been spreading the pills out.  Denies fever, chills, night sweats. History of asthma as a child.      Past Medical History:  Diagnosis Date  . Gallstones   . GERD (gastroesophageal reflux disease)   . H/O knee surgery   . Hepatic steatosis   . Obesity   . PCOS (polycystic ovarian syndrome)     Patient Active Problem List   Diagnosis Date Noted  . Anemia 01/02/2018  . LLQ pain 09/12/2016  . Urinary incontinence 02/12/2016  . B12 deficiency 01/25/2016  . Fatigue 08/14/2015  . PCOS (polycystic ovarian syndrome) 02/11/2015  . Morbid obesity ([place]) 02/11/2015  . Pes anserine bursitis 01/30/2015  . Pes planus of both feet 12/07/2014  . Low back pain 11/16/2014  . Nonallopathic lesion of thoracic region 11/16/2014  . Patellofemoral pain syndrome 11/16/2014  . Nonallopathic lesion of lumbosacral region 11/16/2014  . Nonallopathic lesion of sacral region 11/16/2014    Past Surgical History:  Procedure Laterality Date  . APPENDECTOMY    . CHOLECYSTECTOMY N/A 11/01/2016   Procedure: LAPAROSCOPIC CHOLECYSTECTOMY;  Surgeon: Clovis Riley, MD;  Location: [facility];  Service: General;  Laterality: N/A;  . COLONOSCOPY    . KNEE SURGERY Right   .  TONSILLECTOMY      OB History   None      Home Medications    Prior to Admission medications   Medication Sig Start Date End Date Taking? Authorizing Provider  fluticasone (FLONASE) 50 MCG/ACT nasal spray Place into both nostrils daily.   Yes [provider]  loratadine (CLARITIN) 10 MG tablet Take 10 mg by mouth daily.   Yes [provider]  acetaminophen (TYLENOL) 325 MG tablet Take 325 mg by mouth every 6 (six) hours as needed (for pain/).    [provider]  amoxicillin-clavulanate (AUGMENTIN) 875-125 MG tablet Take 1 tablet by mouth every 12 (twelve) hours. Fill on 4/15 if symptoms not improving 02/09/18   Tasia Catchings, Svea Pusch V, PA-C  cholecalciferol (VITAMIN D) 1000 UNITS tablet Take 3,000 Units by mouth daily.    [provider]  cyanocobalamin (,VITAMIN B-12,) 1000 MCG/ML injection INJECT 1 ML (1,000 MCG TOTAL) INTO THE MUSCLE EVERY 30 DAYS. 02/12/16   Hoyt Koch, MD  Ferrous Sulfate (IRON) 325 (65 Fe) MG TABS Take 325 mg by mouth 2 (two) times daily after a meal.    [provider]  ipratropium (ATROVENT) 0.06 % nasal spray Place 2 sprays into both nostrils 4 (four) times daily. 02/06/18   Tasia Catchings, Prerana Strayer V, PA-C  loratadine-pseudoephedrine (CLARITIN-D 24 HOUR) 10-240 MG 24 hr tablet Take 1 tablet by mouth daily. 02/06/18   Ok Edwards, PA-C  Multiple Vitamins-Minerals (  MULTIVITAMIN WITH MINERALS) tablet Take 1 tablet by mouth daily.    [provider]  pantoprazole (PROTONIX) 40 MG tablet Take 40 mg by mouth daily.    [provider]  Polyethylene Glycol 3350 (MIRALAX PO) Take 17 g by mouth daily.    [provider]  predniSONE (DELTASONE) 50 MG tablet Take 1 tablet (50 mg total) by mouth daily for 5 days. 02/06/18 02/11/18  Ok Edwards, PA-C  Probiotic Product (PROBIOTIC PO) Take 1 capsule by mouth daily.    [provider]  Thiamine HCl (VITAMIN B-1) 250 MG tablet Take 250 mg by mouth daily. Reported on 02/12/2016     [provider]    Family History Family History  Problem Relation Age of Onset  . Colon cancer Neg Hx   . Esophageal cancer Neg Hx   . Pancreatic cancer Neg Hx   . Stomach cancer Neg Hx   . Liver disease Neg Hx     Social History Social History   Tobacco Use  . Smoking status: Never Smoker  . Smokeless tobacco: Never Used  Substance Use Topics  . Alcohol use: Yes    Alcohol/week: 0.0 oz    Comment: rare  . Drug use: No     Allergies   Latex   Review of Systems Review of Systems  Reason unable to perform ROS: See HPI as above.     Physical Exam Triage Vital Signs ED Triage Vitals [02/06/18 1246]  Enc Vitals Group     BP 116/73     Pulse Rate 98     Resp 16     Temp 97.9 F (36.6 C)     Temp Source Oral     SpO2 100 %     Weight      Height      Head Circumference      Peak Flow      Pain Score      Pain Loc      Pain Edu?      Excl. in [place]?    No data found.  Updated Vital Signs BP 116/73 (BP Location: Left Arm)   Pulse 98   Temp 97.9 F (36.6 C) (Oral)   Resp 16   SpO2 100%   Physical Exam  Constitutional: She is oriented to person, place, and time. She appears well-developed and well-nourished. No distress.  HENT:  Head: Normocephalic and atraumatic.  Right Ear: Tympanic membrane, external ear and ear canal normal. Tympanic membrane is not erythematous and not bulging.  Left Ear: Tympanic membrane, external ear and ear canal normal. Tympanic membrane is not erythematous and not bulging.  Nose: Mucosal edema and rhinorrhea present. Right sinus exhibits maxillary sinus tenderness and frontal sinus tenderness. Left sinus exhibits maxillary sinus tenderness and frontal sinus tenderness.  Mouth/Throat: Uvula is midline, oropharynx is clear and moist and mucous membranes are normal.  Eyes: Pupils are equal, round, and reactive to light. Conjunctivae are normal.  Neck: Normal range of motion. Neck supple.  Cardiovascular: Normal rate,  regular rhythm and normal heart sounds. Exam reveals no gallop and no friction rub.  No murmur heard. Pulmonary/Chest: Effort normal and breath sounds normal. She has no decreased breath sounds. She has no wheezes. She has no rhonchi. She has no rales.  Lymphadenopathy:    She has no cervical adenopathy.  Neurological: She is alert and oriented to person, place, and time.  Skin: Skin is warm and dry.  Psychiatric: She has  a normal mood and affect. Her behavior is normal. Judgment normal.     UC Treatments / Results  Labs (all labs ordered are listed, but only abnormal results are displayed) Labs Reviewed - No data to display  EKG None Radiology No results found.  Procedures Procedures (including critical care time)  Medications Ordered in UC Medications - No data to display   Initial Impression / Assessment and Plan / UC Course  I have reviewed the triage vital signs and the nursing notes.  Pertinent labs & imaging results that were available during my care of the patient were reviewed by me and considered in my medical decision making (see chart for details).    Will fill Claritin-D. Prednisone for sinus pressure. Atrovent, flonase as directed. Other symptomatic treatment discussed. Discussed possible seasonal allergies vs viral URI. However, given 1 week history, will provide Rx of Augmentin, can fill in 3-4 days for sinusitis if symptoms not improving. Return precautions given. Patient expresses understanding and agress to plan.   Final Clinical Impressions(s) / UC Diagnoses   Final diagnoses:  Nasal congestion  Sinus pressure    ED Discharge Orders        Ordered    amoxicillin-clavulanate (AUGMENTIN) 875-125 MG tablet  Every 12 hours     02/06/18 1335    predniSONE (DELTASONE) 50 MG tablet  Daily     02/06/18 1335    ipratropium (ATROVENT) 0.06 % nasal spray  4 times daily     02/06/18 1335    loratadine-pseudoephedrine (CLARITIN-D 24 HOUR) 10-240 MG 24 hr  tablet  Daily     02/06/18 1335        Ok Edwards, PA-C 02/06/18 1346

## 2018-02-06 NOTE — Telephone Encounter (Signed)
Patient called in with c/o "mild SOB." She says "it started Monday and progressively got worse with the pollen. I have been taking allergy medicine, but my nasal congestion is not better and when I am outside walking or doing anything other than resting, I feel a heaviness in my chest and it's my breathing is harder. I don't hear wheezing and I don't have any lung problems. I had a cough, which is better. I had some dizziness this week too, but not today. No chest pain, no fever." According to protocol, see PCP within 4 hours (UC). I advised she could be seen in the office today, but she needed an earlier appointment time. She says "I will go to the UC." Care advice given, patient verbalized understanding.  Reason for Disposition . [1] MILD difficulty breathing (e.g., minimal/no SOB at rest, SOB with walking, pulse <100) AND [2] NEW-onset or WORSE than normal  Answer Assessment - Initial Assessment Questions 1. RESPIRATORY STATUS: "Describe your breathing?" (e.g., wheezing, shortness of breath, unable to speak, severe coughing)      Fine right now, just nasal congestion, chest feel a little heavy 2. ONSET: "When did this breathing problem begin?"      Monday 3. PATTERN "Does the difficult breathing come and go, or has it been constant since it started?"      Only when I'm outside, exerting my self 4. SEVERITY: "How bad is your breathing?" (e.g., mild, moderate, severe)    - MILD: No SOB at rest, mild SOB with walking, speaks normally in sentences, can lay down, no retractions, pulse < 100.    - MODERATE: SOB at rest, SOB with minimal exertion and prefers to sit, cannot lie down flat, speaks in phrases, mild retractions, audible wheezing, pulse 100-120.    - SEVERE: Very SOB at rest, speaks in single words, struggling to breathe, sitting hunched forward, retractions, pulse > 120      Mild while inside; worse when outside with pollen 5. RECURRENT SYMPTOM: "Have you had difficulty breathing before?" If  so, ask: "When was the last time?" and "What happened that time?"      No 6. CARDIAC HISTORY: "Do you have any history of heart disease?" (e.g., heart attack, angina, bypass surgery, angioplasty)      No 7. LUNG HISTORY: "Do you have any history of lung disease?"  (e.g., pulmonary embolus, asthma, emphysema)     No 8. CAUSE: "What do you think is causing the breathing problem?"      Think it's the pollen 9. OTHER SYMPTOMS: "Do you have any other symptoms? (e.g., dizziness, runny nose, cough, chest pain, fever)     Nasal congestion, had a cough, had dizziness this week 10. PREGNANCY: "Is there any chance you are pregnant?" "When was your last menstrual period?"       No-LMP 3 months ago 11. TRAVEL: "Have you traveled out of the country in the last month?" (e.g., travel history, exposures)       No  Protocols used: BREATHING DIFFICULTY-A-AH

## 2018-02-06 NOTE — ED Triage Notes (Signed)
Pt c/o head cold a week ago, allergies, sinus issues, pt states "I feel like its hard to catch my breath" Pt states the weather affects everything. Pt taking claritin D and flonase. Pt states "I still feel very stopped up".

## 2018-02-06 NOTE — Discharge Instructions (Signed)
Start prednisone for sinus pressure. Start flonase, atrovent nasal spray, Claritin-D for nasal congestion/drainage. You can use over the counter nasal saline rinse such as neti pot for nasal congestion. Keep hydrated, your urine should be clear to pale yellow in color. Tylenol/motrin for fever and pain. Monitor for any worsening of symptoms, chest pain, shortness of breath, wheezing, swelling of the throat, follow up for reevaluation.   Can fill Augmentin on 4/15 if symptoms still not improving for sinus infection.  For sore throat/cough try using a honey-based tea. Use 3 teaspoons of honey with juice squeezed from half lemon. Place shaved pieces of ginger into 1/2-1 cup of water and warm over stove top. Then mix the ingredients and repeat every 4 hours as needed.

## 2018-02-17 ENCOUNTER — Encounter: Payer: Self-pay | Admitting: Internal Medicine

## 2018-02-17 MED FILL — ALLERGY RELIEF-NASAL DECONG: 10-240 | 30 days supply | Qty: 30 | Fill #0

## 2018-03-12 ENCOUNTER — Institutional Professional Consult (permissible substitution): Payer: 59 | Admitting: Internal Medicine

## 2018-09-28 DIAGNOSIS — Z6841 Body Mass Index (BMI) 40.0 and over, adult: Secondary | ICD-10-CM | POA: Diagnosis not present

## 2018-09-28 DIAGNOSIS — E282 Polycystic ovarian syndrome: Secondary | ICD-10-CM | POA: Diagnosis not present

## 2018-09-28 DIAGNOSIS — Z01419 Encounter for gynecological examination (general) (routine) without abnormal findings: Secondary | ICD-10-CM | POA: Diagnosis not present

## 2018-09-28 MED FILL — MEDROXYPROGESTERONE 10 MG T: 10 | 10 days supply | Qty: 10 | Fill #0

## 2018-09-28 MED FILL — LETROZOLE 2.5 MG TABLET: 2.5 | 5 days supply | Qty: 5 | Fill #0

## 2018-10-19 DIAGNOSIS — N979 Female infertility, unspecified: Secondary | ICD-10-CM | POA: Diagnosis not present

## 2018-10-19 DIAGNOSIS — R102 Pelvic and perineal pain: Secondary | ICD-10-CM | POA: Diagnosis not present

## 2018-10-26 ENCOUNTER — Encounter: Payer: Self-pay | Admitting: Family

## 2018-10-26 ENCOUNTER — Telehealth: Payer: 59 | Admitting: Family

## 2018-10-26 DIAGNOSIS — J029 Acute pharyngitis, unspecified: Secondary | ICD-10-CM | POA: Diagnosis not present

## 2018-10-26 MED ORDER — AZITHROMYCIN 250 MG PO TABS: ORAL_TABLET | ORAL | 0 refills | Status: DC

## 2018-10-26 MED ORDER — PREDNISONE 5 MG PO TABS: 5.0000 mg | ORAL_TABLET | ORAL | 0 refills | Status: DC

## 2018-10-26 MED ORDER — BENZONATATE 100 MG PO CAPS: 100.0000 mg | ORAL_CAPSULE | Freq: Three times a day (TID) | ORAL | 0 refills | Status: DC | PRN

## 2018-10-26 MED ORDER — ALBUTEROL SULFATE HFA 108 (90 BASE) MCG/ACT IN AERS: 2.0000 | INHALATION_SPRAY | Freq: Four times a day (QID) | RESPIRATORY_TRACT | 2 refills | Status: DC | PRN

## 2018-10-26 MED FILL — VENTOLIN HFA 90 MCG INHALER: 108 (90 BAS | 25 days supply | Qty: 18 | Fill #0

## 2018-10-26 MED FILL — BENZONATATE 100 MG CAP: 100 | 5 days supply | Qty: 30 | Fill #0

## 2018-10-26 MED FILL — AZITHROMYCIN 250 MG TABLET: 250 | 5 days supply | Qty: 6 | Fill #0

## 2018-10-26 MED FILL — predniSONE 5 MG (21) TBPK: 5 | 21 days supply | Qty: 21 | Fill #0

## 2018-10-26 NOTE — Progress Notes (Signed)
Thank you for the details you included in the comment boxes. Those details are very helpful in determining the best course of treatment for you and help [place] to provide the best care. As this is technically day 6, we would often consider treating it as a bacterial infection. However, as the fever began and resolved after a couple days, that is more in line with a viral infection, which your body is winning the fight against. You can definitely have inflammation in your airways, which could cause irritation and coughing.   As I have known you for years at [facility], I am aware of your clinical judgment and will send antibiotics as below, but I want you to hold them for 24 hours while you observe for improvement with the other parts of the treatment plan below. If you are not beginning to see some improvement in the next 24 hours with the other medications and supportive care, begin the antibiotics. If you do see improvement, then this is likely a virus and you will continue to improve without antibiotics. (Sent optional Z-pack plus plan below)  We are sorry that you are not feeling well.  Here is how we plan to help!  Based on your presentation I believe you most likely have A cough due to a virus.  This is called viral bronchitis and is best treated by rest, plenty of fluids and control of the cough.  You may use Ibuprofen or Tylenol as directed to help your symptoms.     In addition you may use A non-prescription cough medication called Mucinex DM: take 2 tablets every 12 hours. and A prescription cough medication called Tessalon Perles 100mg . You may take 1-2 capsules every 8 hours as needed for your cough.  I have also sent an albuterol inhaler, take 2 puffs every 6 hours as needed for cough.   Prednisone 5 mg daily for 6 days (see taper instructions below)  Directions for 6 day taper: Day 1: 2 tablets before breakfast, 1 after both lunch & dinner and 2 at bedtime Day 2: 1 tab before breakfast, 1 after  both lunch & dinner and 2 at bedtime Day 3: 1 tab at each meal & 1 at bedtime Day 4: 1 tab at breakfast, 1 at lunch, 1 at bedtime Day 5: 1 tab at breakfast & 1 tab at bedtime Day 6: 1 tab at breakfast   From your responses in the eVisit questionnaire you describe inflammation in the upper respiratory tract which is causing a significant cough.  This is commonly called Bronchitis and has four common causes:    Allergies  Viral Infections  Acid Reflux  Bacterial Infection Allergies, viruses and acid reflux are treated by controlling symptoms or eliminating the cause. An example might be a cough caused by taking certain blood pressure medications. You stop the cough by changing the medication. Another example might be a cough caused by acid reflux. Controlling the reflux helps control the cough.  USE OF BRONCHODILATOR ("RESCUE") INHALERS: There is a risk from using your bronchodilator too frequently.  The risk is that over-reliance on a medication which only relaxes the muscles surrounding the breathing tubes can reduce the effectiveness of medications prescribed to reduce swelling and congestion of the tubes themselves.  Although you feel brief relief from the bronchodilator inhaler, your asthma may actually be worsening with the tubes becoming more swollen and filled with mucus.  This can delay other crucial treatments, such as oral steroid medications. If you need  to use a bronchodilator inhaler daily, several times per day, you should discuss this with your provider.  There are probably better treatments that could be used to keep your asthma under control.     HOME CARE . Only take medications as instructed by your medical team. . Complete the entire course of an antibiotic. . Drink plenty of fluids and get plenty of rest. . Avoid close contacts especially the very young and the elderly . Cover your mouth if you cough or cough into your sleeve. . Always remember to wash your hands . A  steam or ultrasonic humidifier can help congestion.   GET HELP RIGHT AWAY IF: . You develop worsening fever. . You become short of breath . You cough up blood. . Your symptoms persist after you have completed your treatment plan MAKE SURE YOU   Understand these instructions.  Will watch your condition.  Will get help right away if you are not doing well or get worse.  Your e-visit answers were reviewed by a board certified advanced clinical practitioner to complete your personal care plan.  Depending on the condition, your plan could have included both over the counter or prescription medications. If there is a problem please reply  once you have received a response from your provider. Your safety is important to [place].  If you have drug allergies check your prescription carefully.    You can use MyChart to ask questions about today's visit, request a non-urgent call back, or ask for a work or school excuse for 24 hours related to this e-Visit. If it has been greater than 24 hours you will need to follow up with your provider, or enter a new e-Visit to address those concerns. You will get an e-mail in the next two days asking about your experience.  I hope that your e-visit has been valuable and will speed your recovery. Thank you for using e-visits.

## 2019-06-18 ENCOUNTER — Encounter: Payer: Self-pay | Admitting: Internal Medicine

## 2019-06-18 ENCOUNTER — Ambulatory Visit (INDEPENDENT_AMBULATORY_CARE_PROVIDER_SITE_OTHER): Payer: BC Managed Care – PPO | Admitting: Internal Medicine

## 2019-06-18 DIAGNOSIS — J309 Allergic rhinitis, unspecified: Secondary | ICD-10-CM | POA: Diagnosis not present

## 2019-06-18 DIAGNOSIS — L040 Acute lymphadenitis of face, head and neck: Secondary | ICD-10-CM

## 2019-06-18 DIAGNOSIS — J019 Acute sinusitis, unspecified: Secondary | ICD-10-CM | POA: Diagnosis not present

## 2019-06-18 MED ORDER — SULFAMETHOXAZOLE-TRIMETHOPRIM 800-160 MG PO TABS: 1.0000 | ORAL_TABLET | Freq: Two times a day (BID) | ORAL | 0 refills | Status: DC

## 2019-06-18 MED FILL — SULFAMETHOXAZOLE-TMP DS TAB: 800-160 | 10 days supply | Qty: 20 | Fill #0

## 2019-06-18 NOTE — Assessment & Plan Note (Signed)
Ok to continue her otc claritin D and flonase

## 2019-06-18 NOTE — Progress Notes (Signed)
Patient ID: Katrina Larson, female   DOB: October 19, 1981, 38 y.o.   MRN: [id number]  Virtual Visit via Video Note  I connected with Katrina Larson on 06/18/19 at 11:20 AM EDT by a video enabled telemedicine application and verified that I am speaking with the correct person using two identifiers.  Location: Patient: at home Provider: at office   I discussed the limitations of evaluation and management by telemedicine and the availability of in person appointments. The patient expressed understanding and agreed to proceed.  History of Present Illness:  Here with 2-3 days acute onset fever, left maxillary area facial pain, pressure, and swelling, headache, general weakness and malaise, and greenish d/c, with mild ST and cough, but pt denies chest pain, wheezing, increased sob or doe, orthopnea, PND, increased LE swelling, palpitations, dizziness or syncope.  Does also have a knot tender just preauricular but no other overt LA she has found.    Pt denies new neurological symptoms such as new headache, or facial or extremity weakness or numbness   Pt denies polydipsia, polyuria,  Past Medical History:  Diagnosis Date  . Gallstones   . GERD (gastroesophageal reflux disease)   . H/O knee surgery   . Hepatic steatosis   . Obesity   . PCOS (polycystic ovarian syndrome)    Past Surgical History:  Procedure Laterality Date  . APPENDECTOMY    . CHOLECYSTECTOMY N/A 11/01/2016   Procedure: LAPAROSCOPIC CHOLECYSTECTOMY;  Surgeon: Clovis Riley, MD;  Location: [place];  Service: General;  Laterality: N/A;  . COLONOSCOPY    . KNEE SURGERY Right   . TONSILLECTOMY      reports that she has never smoked. She has never used smokeless tobacco. She reports current alcohol use. She reports that she does not use drugs. family history is not on file. Allergies  Allergen Reactions  . Latex Hives and Rash   Current Outpatient Medications on File Prior to Visit  Medication Sig Dispense Refill  .  acetaminophen (TYLENOL) 325 MG tablet Take 325 mg by mouth every 6 (six) hours as needed (for pain/).    Marland Kitchen albuterol (PROVENTIL HFA;VENTOLIN HFA) 108 (90 Base) MCG/ACT inhaler Inhale 2 puffs into the lungs every 6 (six) hours as needed for wheezing or shortness of breath. 1 Inhaler 2  . amoxicillin-clavulanate (AUGMENTIN) 875-125 MG tablet Take 1 tablet by mouth every 12 (twelve) hours. Fill on 4/15 if symptoms not improving 14 tablet 0  . azithromycin (ZITHROMAX) 250 MG tablet Take 2 tabs now then 1 daily times 4 days 6 tablet 0  . benzonatate (TESSALON PERLES) 100 MG capsule Take 1-2 capsules (100-200 mg total) by mouth every 8 (eight) hours as needed for cough. 30 capsule 0  . cholecalciferol (VITAMIN D) 1000 UNITS tablet Take 3,000 Units by mouth daily.    . cyanocobalamin (,VITAMIN B-12,) 1000 MCG/ML injection INJECT 1 ML (1,000 MCG TOTAL) INTO THE MUSCLE EVERY 30 DAYS. 4 mL 3  . Ferrous Sulfate (IRON) 325 (65 Fe) MG TABS Take 325 mg by mouth 2 (two) times daily after a meal.    . fluticasone (FLONASE) 50 MCG/ACT nasal spray Place into both nostrils daily.    Marland Kitchen ipratropium (ATROVENT) 0.06 % nasal spray Place 2 sprays into both nostrils 4 (four) times daily. 15 mL 0  . loratadine (CLARITIN) 10 MG tablet Take 10 mg by mouth daily.    Marland Kitchen loratadine-pseudoephedrine (CLARITIN-D 24 HOUR) 10-240 MG 24 hr tablet Take 1 tablet by mouth daily. 30 tablet  1  . Multiple Vitamins-Minerals (MULTIVITAMIN WITH MINERALS) tablet Take 1 tablet by mouth daily.    . pantoprazole (PROTONIX) 40 MG tablet Take 40 mg by mouth daily.    . Polyethylene Glycol 3350 (MIRALAX PO) Take 17 g by mouth daily.    . predniSONE (DELTASONE) 5 MG tablet Take 1 tablet (5 mg total) by mouth as directed. Taper 6,5,4,3,2,1 21 tablet 0  . Probiotic Product (PROBIOTIC PO) Take 1 capsule by mouth daily.    . Thiamine HCl (VITAMIN B-1) 250 MG tablet Take 250 mg by mouth daily. Reported on 02/12/2016     No current facility-administered  medications on file prior to visit.     Observations/Objective: Alert, NAD, appropriate mood and affect, left facial sweling over the maxillary notedl  resps normal, cn 2-12 intact, moves all 4s, no visible rash or swelling Lab Results  Component Value Date   WBC 8.6 01/02/2018   HGB 10.9 (L) 01/02/2018   HCT 34.3 (L) 01/02/2018   PLT 351.0 01/02/2018   GLUCOSE 87 01/02/2018   CHOL 181 01/02/2018   TRIG 127.0 01/02/2018   HDL 42.80 01/02/2018   LDLCALC 113 (H) 01/02/2018   ALT 13 01/02/2018   AST 14 01/02/2018   NA 139 01/02/2018   K 3.8 01/02/2018   CL 104 01/02/2018   CREATININE 0.77 01/02/2018   BUN 11 01/02/2018   CO2 25 01/02/2018   TSH 3.65 01/02/2018   HGBA1C 6.1 01/02/2018   Assessment and Plan: See notes  Follow Up Instructions: See notes   I discussed the assessment and treatment plan with the patient. The patient was provided an opportunity to ask questions and all were answered. The patient agreed with the plan and demonstrated an understanding of the instructions.   The patient was advised to call back or seek an in-person evaluation if the symptoms worsen or if the condition fails to improve as anticipated.   Cathlean Cower, MD

## 2019-06-18 NOTE — Assessment & Plan Note (Signed)
Mild to mod, for antibx course,  to f/u any worsening symptoms or concerns 

## 2019-06-18 NOTE — Assessment & Plan Note (Signed)
D/w pt regarding the left preauricular knot likely lymphadenitis related to current sinus infection - Mild to mod, for antibx course,  to f/u any worsening symptoms or concerns

## 2019-06-18 NOTE — Patient Instructions (Signed)
Please take all new medication as prescribed - the antibiotic  Ok to continue the claritin D and flonase  Please continue all other medications as before, and refills have been done if requested.  Please have the pharmacy call with any other refills you may need.  Please keep your appointments with your specialists as you may have planned

## 2019-11-01 ENCOUNTER — Encounter: Payer: BC Managed Care – PPO | Admitting: Internal Medicine

## 2019-11-04 ENCOUNTER — Encounter: Payer: Self-pay | Admitting: Internal Medicine

## 2019-11-04 ENCOUNTER — Other Ambulatory Visit: Payer: Self-pay

## 2019-11-04 ENCOUNTER — Ambulatory Visit (INDEPENDENT_AMBULATORY_CARE_PROVIDER_SITE_OTHER): Payer: 59 | Admitting: Internal Medicine

## 2019-11-04 VITALS — BP 138/84 | HR 101 | Temp 98.3°F | Ht 65.0 in | Wt 244.0 lb

## 2019-11-04 DIAGNOSIS — Z0000 Encounter for general adult medical examination without abnormal findings: Secondary | ICD-10-CM | POA: Diagnosis not present

## 2019-11-04 DIAGNOSIS — J309 Allergic rhinitis, unspecified: Secondary | ICD-10-CM | POA: Diagnosis not present

## 2019-11-04 DIAGNOSIS — E538 Deficiency of other specified B group vitamins: Secondary | ICD-10-CM | POA: Diagnosis not present

## 2019-11-04 LAB — COMPREHENSIVE METABOLIC PANEL
ALT: 13 U/L (ref 0–35)
AST: 14 U/L (ref 0–37)
Albumin: 4.2 g/dL (ref 3.5–5.2)
Alkaline Phosphatase: 100 U/L (ref 39–117)
BUN: 11 mg/dL (ref 6–23)
CO2: 26 mEq/L (ref 19–32)
Calcium: 9.1 mg/dL (ref 8.4–10.5)
Chloride: 101 mEq/L (ref 96–112)
Creatinine, Ser: 0.82 mg/dL (ref 0.40–1.20)
GFR: 77.57 mL/min (ref >60.00)
Glucose, Bld: 96 mg/dL (ref 70–99)
Potassium: 3.6 mEq/L (ref 3.5–5.1)
Sodium: 137 mEq/L (ref 135–145)
Total Bilirubin: 0.3 mg/dL (ref 0.2–1.2)
Total Protein: 7.7 g/dL (ref 6.0–8.3)

## 2019-11-04 LAB — LIPID PANEL
Cholesterol: 186 mg/dL (ref 0–200)
HDL: 43.5 mg/dL (ref >39.00)
LDL Cholesterol: 119 mg/dL — ABNORMAL HIGH (ref 0–99)
NonHDL: 142.04
Total CHOL/HDL Ratio: 4
Triglycerides: 113 mg/dL (ref 0.0–149.0)
VLDL: 22.6 mg/dL (ref 0.0–40.0)

## 2019-11-04 LAB — CBC
HCT: 35.3 % — ABNORMAL LOW (ref 36.0–46.0)
Hemoglobin: 11.1 g/dL — ABNORMAL LOW (ref 12.0–15.0)
MCHC: 31.3 g/dL (ref 30.0–36.0)
MCV: 77 fl — ABNORMAL LOW (ref 78.0–100.0)
Platelets: 324 10*3/uL (ref 150.0–400.0)
RBC: 4.58 Mil/uL (ref 3.87–5.11)
RDW: 15.9 % — ABNORMAL HIGH (ref 11.5–15.5)
WBC: 9.6 10*3/uL (ref 4.0–10.5)

## 2019-11-04 LAB — TSH: TSH: 1.97 u[IU]/mL (ref 0.35–4.50)

## 2019-11-04 LAB — HEMOGLOBIN A1C: Hgb A1c MFr Bld: 6.1 % (ref 4.6–6.5)

## 2019-11-04 LAB — VITAMIN B12: Vitamin B-12: 195 pg/mL — ABNORMAL LOW (ref 211–911)

## 2019-11-04 LAB — T4, FREE: Free T4: 1.02 ng/dL (ref 0.60–1.60)

## 2019-11-04 MED ORDER — CLARITIN-D 24 HOUR 10-240 MG PO TB24: 1.0000 | ORAL_TABLET | Freq: Every day | ORAL | 3 refills | Status: AC

## 2019-11-04 MED FILL — LORATADINE-D 24HR TAB: 10-240 | 90 days supply | Qty: 90 | Fill #0

## 2019-11-04 NOTE — Progress Notes (Signed)
   Subjective:   Patient ID: Katrina Larson, female    DOB: 1981/03/23, 39 y.o.   MRN: [id number]  HPI The patient is a 39 YO female coming in for physical. Husband passed unexpectedly last summer.   PMH, [facility], social history reviewed and updated.   Review of Systems  Constitutional: Negative.   HENT: Negative.   Eyes: Negative.   Respiratory: Negative for cough, chest tightness and shortness of breath.   Cardiovascular: Negative for chest pain, palpitations and leg swelling.  Gastrointestinal: Negative for abdominal distention, abdominal pain, constipation, diarrhea, nausea and vomiting.  Musculoskeletal: Negative.   Skin: Negative.   Neurological: Negative.   Psychiatric/Behavioral: Positive for dysphoric mood.    Objective:  Physical Exam Constitutional:      Appearance: She is well-developed.  HENT:     Head: Normocephalic and atraumatic.  Cardiovascular:     Rate and Rhythm: Normal rate and regular rhythm.  Pulmonary:     Effort: Pulmonary effort is normal. No respiratory distress.     Breath sounds: Normal breath sounds. No wheezing or rales.  Abdominal:     General: Bowel sounds are normal. There is no distension.     Palpations: Abdomen is soft.     Tenderness: There is no abdominal tenderness. There is no rebound.  Musculoskeletal:     Cervical back: Normal range of motion.  Skin:    General: Skin is warm and dry.  Neurological:     Mental Status: She is alert and oriented to person, place, and time.     Coordination: Coordination normal.     Vitals:   11/04/19 1525  BP: 138/84  Pulse: (!) 101  Temp: 98.3 F (36.8 C)  TempSrc: Oral  SpO2: 98%  Weight: 244 lb (110.7 kg)  Height: 5\' 5"  (1.651 m)    This visit occurred during the SARS-CoV-2 public health emergency.  Safety protocols were in place, including screening questions prior to the visit, additional usage of staff PPE, and extensive cleaning of exam room while observing appropriate contact  time as indicated for disinfecting solutions.   Assessment & Plan:

## 2019-11-04 NOTE — Patient Instructions (Addendum)
Health Maintenance, Female Adopting a healthy lifestyle and getting preventive care are important in promoting health and wellness. Ask your health care provider about:  The right schedule for you to have regular tests and exams.  Things you can do on your own to prevent diseases and keep yourself healthy. What should I know about diet, weight, and exercise? Eat a healthy diet   Eat a diet that includes plenty of vegetables, fruits, low-fat dairy products, and lean protein.  Do not eat a lot of foods that are high in solid fats, added sugars, or sodium. Maintain a healthy weight Body mass index (BMI) is used to identify weight problems. It estimates body fat based on height and weight. Your health care provider can help determine your BMI and help you achieve or maintain a healthy weight. Get regular exercise Get regular exercise. This is one of the most important things you can do for your health. Most adults should:  Exercise for at least 150 minutes each week. The exercise should increase your heart rate and make you sweat (moderate-intensity exercise).  Do strengthening exercises at least twice a week. This is in addition to the moderate-intensity exercise.  Spend less time sitting. Even light physical activity can be beneficial. Watch cholesterol and blood lipids Have your blood tested for lipids and cholesterol at 40 years of age, then have this test every 5 years. Have your cholesterol levels checked more often if:  Your lipid or cholesterol levels are high.  You are older than 40 years of age.  You are at high risk for heart disease. What should I know about cancer screening? Depending on your health history and family history, you may need to have cancer screening at various ages. This may include screening for:  Breast cancer.  Cervical cancer.  Colorectal cancer.  Skin cancer.  Lung cancer. What should I know about heart disease, diabetes, and high blood  pressure? Blood pressure and heart disease  High blood pressure causes heart disease and increases the risk of stroke. This is more likely to develop in people who have high blood pressure readings, are of African descent, or are overweight.  Have your blood pressure checked: ? Every 3-5 years if you are 18-39 years of age. ? Every year if you are 40 years old or older. Diabetes Have regular diabetes screenings. This checks your fasting blood sugar level. Have the screening done:  Once every three years after age 40 if you are at a normal weight and have a low risk for diabetes.  More often and at a younger age if you are overweight or have a high risk for diabetes. What should I know about preventing infection? Hepatitis B If you have a higher risk for hepatitis B, you should be screened for this virus. Talk with your health care provider to find out if you are at risk for hepatitis B infection. Hepatitis C Testing is recommended for:  Everyone born from 1945 through 1965.  Anyone with known risk factors for hepatitis C. Sexually transmitted infections (STIs)  Get screened for STIs, including gonorrhea and chlamydia, if: ? You are sexually active and are younger than 40 years of age. ? You are older than 40 years of age and your health care provider tells you that you are at risk for this type of infection. ? Your sexual activity has changed since you were last screened, and you are at increased risk for chlamydia or gonorrhea. Ask your health care provider if   you are at risk.  Ask your health care provider about whether you are at high risk for HIV. Your health care provider may recommend a prescription medicine to help prevent HIV infection. If you choose to take medicine to prevent HIV, you should first get tested for HIV. You should then be tested every 3 months for as long as you are taking the medicine. Pregnancy  If you are about to stop having your period (premenopausal) and  you may become pregnant, seek counseling before you get pregnant.  Take 400 to 800 micrograms (mcg) of folic acid every day if you become pregnant.  Ask for birth control (contraception) if you want to prevent pregnancy. Osteoporosis and menopause Osteoporosis is a disease in which the bones lose minerals and strength with aging. This can result in bone fractures. If you are 65 years old or older, or if you are at risk for osteoporosis and fractures, ask your health care provider if you should:  Be screened for bone loss.  Take a calcium or vitamin D supplement to lower your risk of fractures.  Be given hormone replacement therapy (HRT) to treat symptoms of menopause. Follow these instructions at home: Lifestyle  Do not use any products that contain nicotine or tobacco, such as cigarettes, e-cigarettes, and chewing tobacco. If you need help quitting, ask your health care provider.  Do not use street drugs.  Do not share needles.  Ask your health care provider for help if you need support or information about quitting drugs. Alcohol use  Do not drink alcohol if: ? Your health care provider tells you not to drink. ? You are pregnant, may be pregnant, or are planning to become pregnant.  If you drink alcohol: ? Limit how much you use to 0-1 drink a day. ? Limit intake if you are breastfeeding.  Be aware of how much alcohol is in your drink. In the [place] one drink equals one 12 oz bottle of beer (355 mL), one 5 oz glass of wine (148 mL), or one 1 oz glass of hard liquor (44 mL). General instructions  Schedule regular health, dental, and eye exams.  Stay current with your vaccines.  Tell your health care provider if: ? You often feel depressed. ? You have ever been abused or do not feel safe at home. Summary  Adopting a healthy lifestyle and getting preventive care are important in promoting health and wellness.  Follow your health care provider's instructions about healthy  diet, exercising, and getting tested or screened for diseases.  Follow your health care provider's instructions on monitoring your cholesterol and blood pressure. This information is not intended to replace advice given to you by your health care provider. Make sure you discuss any questions you have with your health care provider. Document Revised: 10/07/2018 Document Reviewed: 10/07/2018 Elsevier Patient Education  2020 [employer]  

## 2019-11-05 ENCOUNTER — Encounter: Payer: Self-pay | Admitting: Internal Medicine

## 2019-11-05 DIAGNOSIS — Z0000 Encounter for general adult medical examination without abnormal findings: Secondary | ICD-10-CM | POA: Insufficient documentation

## 2019-11-05 LAB — VITAMIN D 25 HYDROXY (VIT D DEFICIENCY, FRACTURES): VITD: 24.55 ng/mL — ABNORMAL LOW (ref 30.00–100.00)

## 2019-11-05 NOTE — Assessment & Plan Note (Signed)
Weight is down from last year but was up more after loss of spouse abruptly last summer. She is working on her health and wellness with healthy eating and exercise to help.

## 2019-11-05 NOTE — Assessment & Plan Note (Signed)
Flu shot up to date. Tetanus up to date. Pap smear up to date with gyn. Counseled about sun safety and mole surveillance. Counseled about the dangers of distracted driving. Given 10 year screening recommendations.   

## 2019-11-05 NOTE — Assessment & Plan Note (Signed)
Checking B12 level. Adjust as needed.  °

## 2019-11-05 NOTE — Assessment & Plan Note (Signed)
Rx claritin-d for daily use. She is allergic to face mask she is required to wear at work which is making her symptoms worse this year.

## 2019-11-15 ENCOUNTER — Ambulatory Visit (INDEPENDENT_AMBULATORY_CARE_PROVIDER_SITE_OTHER): Payer: 59 | Admitting: Family Medicine

## 2019-11-15 ENCOUNTER — Other Ambulatory Visit: Payer: Self-pay

## 2019-11-15 ENCOUNTER — Encounter (INDEPENDENT_AMBULATORY_CARE_PROVIDER_SITE_OTHER): Payer: Self-pay | Admitting: Family Medicine

## 2019-11-15 VITALS — BP 114/82 | HR 94 | Temp 97.9°F | Ht 65.0 in | Wt 237.0 lb

## 2019-11-15 DIAGNOSIS — Z9189 Other specified personal risk factors, not elsewhere classified: Secondary | ICD-10-CM

## 2019-11-15 DIAGNOSIS — D509 Iron deficiency anemia, unspecified: Secondary | ICD-10-CM

## 2019-11-15 DIAGNOSIS — E559 Vitamin D deficiency, unspecified: Secondary | ICD-10-CM | POA: Diagnosis not present

## 2019-11-15 DIAGNOSIS — Z6839 Body mass index (BMI) 39.0-39.9, adult: Secondary | ICD-10-CM

## 2019-11-15 DIAGNOSIS — R5383 Other fatigue: Secondary | ICD-10-CM | POA: Diagnosis not present

## 2019-11-15 DIAGNOSIS — R7303 Prediabetes: Secondary | ICD-10-CM

## 2019-11-15 DIAGNOSIS — Z1331 Encounter for screening for depression: Secondary | ICD-10-CM | POA: Diagnosis not present

## 2019-11-15 DIAGNOSIS — Z0289 Encounter for other administrative examinations: Secondary | ICD-10-CM

## 2019-11-15 DIAGNOSIS — R0602 Shortness of breath: Secondary | ICD-10-CM

## 2019-11-16 LAB — FOLATE: Folate: 9.7 ng/mL (ref >3.0)

## 2019-11-16 LAB — T3: T3, Total: 142 ng/dL (ref 71–180)

## 2019-11-16 LAB — ANEMIA PANEL
Ferritin: 43 ng/mL (ref 15–150)
Folate, Hemolysate: 327 ng/mL
Folate, RBC: 982 ng/mL (ref >498)
Hematocrit: 33.3 % — ABNORMAL LOW (ref 34.0–46.6)
Iron Saturation: 8 % — CL (ref 15–55)
Iron: 27 ug/dL (ref 27–159)
Retic Ct Pct: 1.3 % (ref 0.6–2.6)
Total Iron Binding Capacity: 346 ug/dL (ref 250–450)
UIBC: 319 ug/dL (ref 131–425)
Vitamin B-12: 347 pg/mL (ref 232–1245)

## 2019-11-16 LAB — VITAMIN D 25 HYDROXY (VIT D DEFICIENCY, FRACTURES): Vit D, 25-Hydroxy: 27 ng/mL — ABNORMAL LOW (ref 30.0–100.0)

## 2019-11-16 LAB — INSULIN, RANDOM: INSULIN: 17.6 u[IU]/mL (ref 2.6–24.9)

## 2019-11-16 MED ORDER — VITAMIN D (ERGOCALCIFEROL) 1.25 MG (50000 UNIT) PO CAPS: 50000.0000 [IU] | ORAL_CAPSULE | ORAL | 0 refills | Status: DC

## 2019-11-16 MED FILL — VIT D2 1.25 MG (50,000 UNIT: 1.25 MG | 28 days supply | Qty: 4 | Fill #0

## 2019-11-16 NOTE — Progress Notes (Signed)
Chief Complaint:   OBESITY Katrina Larson (MR# [id number]) is a 39 y.o. female who presents for evaluation and treatment of obesity and related comorbidities. Current BMI is Body mass index is 39.44 kg/m.Katrina Larson has been struggling with her weight for many years and has been unsuccessful in either losing weight, maintaining weight loss, or reaching her healthy weight goal.  Katrina Larson is currently in the action stage of change and ready to dedicate time achieving and maintaining a healthier weight. Katrina Larson is interested in becoming our patient and working on intensive lifestyle modifications including (but not limited to) diet and exercise for weight loss.  Katrina Larson was told about our clinic from friends. She has a smoothie with [place] sausage or bacon for breakfast; lunch is hit or miss often with food from the cafeteria; for dinner she has black beans, quinoa and chicken with sweet potatoes.  Katrina Larson's habits were reviewed today and are as follows: Her family eats meals together, she thinks her family will eat healthier with her, her desired weight loss is 87 lbs, she has been heavy most of her life, she started gaining weight after her pregnancy, her heaviest weight ever was 268 pounds, she is a picky eater and doesn't like to eat healthier foods, she craves fruit, sugar, soda, and bread, she skips lunch frequently (depends on work), she frequently makes poor food choices, she has binge eating behaviors and she struggles with emotional eating.  Depression Screen Katrina Larson's Food and Mood (modified PHQ-9) score was 8.  Depression screen PHQ 2/9 11/15/2019  Decreased Interest 1  Down, Depressed, Hopeless 1  PHQ - 2 Score 2  Altered sleeping 1  Tired, decreased energy 2  Change in appetite 1  Feeling bad or failure about yourself  0  Trouble concentrating 2  Moving slowly or fidgety/restless 0  Suicidal thoughts 0  PHQ-9 Score 8  Difficult doing work/chores Somewhat difficult    Subjective:   Other fatigue. Katrina Larson denies daytime somnolence and admits to waking up still tired. Katrina Larson generally gets 5-6 hours of sleep per night, and states that she does not sleep well most nights. Snoring is present. Apneic episodes are not present. Epworth Sleepiness Score is 7.   Shortness of breath on exertion. [name] notes increasing shortness of breath with exercising and seems to be worsening over time with weight gain. She notes getting out of breath sooner with activity than she used to. This has gotten worse recently. Katrina Larson denies shortness of breath at rest or orthopnea.   Vitamin D deficiency. Last Vitamin D level was 24.55 on 11/04/2019. This was discussed with the patient.  Prediabetes.  Katrina Larson has a diagnosis of prediabetes based on her elevated HgA1c and was informed this puts her at greater risk of developing diabetes. She continues to work on diet and exercise to decrease her risk of diabetes. She denies nausea or hypoglycemia. No insulin level on file. She has a history of PCOS.  Lab Results  Component Value Date   HGBA1C 6.1 11/04/2019    Microcytic anemia. MCV was 77.0 on 11/04/2019 with an H&H of 11.1 and 35.3. This was discussed with the patient.  Depression screening. Depression screening was performed today.  At risk for diabetes mellitus. [name] is at higher than average risk for developing diabetes due to her obesity.   Assessment/Plan:   Other fatigue.  Katrina Larson does feel that her weight is causing her energy to be lower than it should be. Fatigue may be related to  obesity, depression or many other causes. Labs will be ordered, and in the meanwhile, Katrina Larson will focus on self care including making healthy food choices, increasing physical activity and focusing on stress reduction. EKG 12-Lead showed normal sinus rhythm at 93 BPM. Folate, Vitamin B12 levels ordered.  Shortness of breath on exertion. Ineze does feel that she gets out of  breath more easily that she used to when she exercises. Katrina Larson's shortness of breath appears to be obesity related and exercise induced. She has agreed to work on weight loss and gradually increase exercise to treat her exercise induced shortness of breath. Will continue to monitor closely.   Vitamin D deficiency. Low Vitamin D level contributes to fatigue and are associated with obesity, breast, and colon cancer. She agrees to take prescription Vitamin D, Ergocalciferol, (DRISDOL) 1.25 MG (50000 UNIT) CAPS capsule every week #4 with 0 refills and will follow-up for routine testing of Vitamin D, at least 2-3 times per year to avoid over-replacement.    Prediabetes. Katrina Larson will continue to work on weight loss, exercise, and decreasing simple carbohydrates to help decrease the risk of diabetes. Insulin, random ordered.  Microcytic anemia. Anemia panel was ordered.  Depression screening. Katrina Larson had a positive depression screening. Depression is commonly associated with obesity and often results in emotional eating behaviors. We will monitor this closely and work on CBT to help improve the non-hunger eating patterns. Referral to Psychology may be required if no improvement is seen as she continues in our clinic.  At risk for diabetes mellitus. Katrina Larson was given approximately 15 minutes of diabetes education and counseling today. We discussed intensive lifestyle modifications today with an emphasis on weight loss as well as increasing exercise and decreasing simple carbohydrates in her diet. We also reviewed medication options with an emphasis on risk versus benefit of those discussed.   Repetitive spaced learning was employed today to elicit superior memory formation and behavioral change.  Class 2 severe obesity with serious comorbidity and body mass index (BMI) of 39.0 to 39.9 in adult, unspecified obesity type ([place]).  Katrina Larson is currently in the action stage of change and her goal is to  continue with weight loss efforts. I recommend [name] begin the structured treatment plan as follows:  She has agreed to the [employer] + 300 calories.  Exercise goals: For substantial health benefits, adults should do at least 150 minutes (2 hours and 30 minutes) a week of moderate-intensity, or 75 minutes (1 hour and 15 minutes) a week of vigorous-intensity aerobic physical activity, or an equivalent combination of moderate- and vigorous-intensity aerobic activity. Aerobic activity should be performed in episodes of at least 10 minutes, and preferably, it should be spread throughout the week. Adults should also include muscle-strengthening activities that involve all major muscle groups on 2 or more days a week.   Behavioral modification strategies: increasing lean protein intake, increasing vegetables, meal planning and cooking strategies, keeping healthy foods in the home and planning for success.  She was informed of the importance of frequent follow-up visits to maximize her success with intensive lifestyle modifications for her multiple health conditions. She was informed we would discuss her lab results at her next visit unless there is a critical issue that needs to be addressed sooner. Savanah agreed to keep her next visit at the agreed upon time to discuss these results.  Objective:   Blood pressure 114/82, pulse 94, temperature 97.9 F (36.6 C), temperature source Oral, height 5\' 5"  (1.651 m), weight 237 lb (  107.5 kg), SpO2 98 %. Body mass index is 39.44 kg/m.  EKG: Sinus  Rhythm with a rate of 93 BPM. Low voltage in precordial leads. Left axis. Abnormal.  Indirect Calorimeter completed today shows a VO2 of 288 and a REE of 2005.  Her calculated basal metabolic rate is [id number] thus her basal metabolic rate is better than expected.  General: Cooperative, alert, well developed, in no acute distress. HEENT: Conjunctivae and lids unremarkable. Cardiovascular: Regular rhythm.   Lungs: Normal work of breathing. Neurologic: No focal deficits.   Lab Results  Component Value Date   CREATININE 0.82 11/04/2019   BUN 11 11/04/2019   NA 137 11/04/2019   K 3.6 11/04/2019   CL 101 11/04/2019   CO2 26 11/04/2019   Lab Results  Component Value Date   ALT 13 11/04/2019   AST 14 11/04/2019   ALKPHOS 100 11/04/2019   BILITOT 0.3 11/04/2019   Lab Results  Component Value Date   HGBA1C 6.1 11/04/2019   HGBA1C 6.1 01/02/2018   HGBA1C 6.0 08/14/2015   HGBA1C 6.0 02/09/2015   Lab Results  Component Value Date   INSULIN WILL FOLLOW 11/15/2019   Lab Results  Component Value Date   TSH 1.97 11/04/2019   Lab Results  Component Value Date   CHOL 186 11/04/2019   HDL 43.50 11/04/2019   LDLCALC 119 (H) 11/04/2019   TRIG 113.0 11/04/2019   CHOLHDL 4 11/04/2019   Lab Results  Component Value Date   WBC 9.6 11/04/2019   HGB 11.1 (L) 11/04/2019   HCT 33.3 (L) 11/15/2019   MCV 77.0 (L) 11/04/2019   PLT 324.0 11/04/2019   Lab Results  Component Value Date   IRON 27 11/15/2019   TIBC 346 11/15/2019   FERRITIN WILL FOLLOW 11/15/2019   Attestation Statements:   Reviewed by clinician on day of visit: allergies, medications, problem list, medical history, surgical history, family history, social history, and previous encounter notes.  I, Michaelene Song, am acting as transcriptionist for Ilene Qua, MD   I have reviewed the above documentation for accuracy and completeness, and I agree with the above. - Ilene Qua MD

## 2019-11-29 ENCOUNTER — Other Ambulatory Visit: Payer: Self-pay

## 2019-11-29 ENCOUNTER — Encounter (INDEPENDENT_AMBULATORY_CARE_PROVIDER_SITE_OTHER): Payer: Self-pay | Admitting: Family Medicine

## 2019-11-29 ENCOUNTER — Ambulatory Visit (INDEPENDENT_AMBULATORY_CARE_PROVIDER_SITE_OTHER): Payer: 59 | Admitting: Family Medicine

## 2019-11-29 VITALS — BP 118/82 | HR 99 | Temp 97.7°F | Ht 65.0 in | Wt 236.0 lb

## 2019-11-29 DIAGNOSIS — Z9189 Other specified personal risk factors, not elsewhere classified: Secondary | ICD-10-CM | POA: Diagnosis not present

## 2019-11-29 DIAGNOSIS — Z6839 Body mass index (BMI) 39.0-39.9, adult: Secondary | ICD-10-CM

## 2019-11-29 DIAGNOSIS — R7303 Prediabetes: Secondary | ICD-10-CM

## 2019-11-29 DIAGNOSIS — E559 Vitamin D deficiency, unspecified: Secondary | ICD-10-CM

## 2019-11-29 DIAGNOSIS — E7849 Other hyperlipidemia: Secondary | ICD-10-CM

## 2019-11-29 MED ORDER — METFORMIN HCL 500 MG PO TABS: 500.0000 mg | ORAL_TABLET | Freq: Every day | ORAL | 0 refills | Status: DC

## 2019-11-29 MED FILL — metFORMIN HCL 500 MG TABS: 500 | 30 days supply | Qty: 30 | Fill #0

## 2019-11-30 NOTE — Progress Notes (Signed)
Chief Complaint:   Katrina Larson is here to discuss her progress with her obesity treatment plan along with follow-up of her obesity related diagnoses. Katrina Larson is on the [employer] +300 calories and states she is following her eating plan approximately 85% of the time. Katrina Larson states she is doing yoga 35 to 40 minutes, 3 to 4 times per week.  Today's visit was #: 2 Starting weight: 237 lbs Starting date: 11/15/2019 Today's weight: 236 lbs Today's date: 11/29/2019 Total lbs lost to date: 1 Total lbs lost since last in-office visit: 1  Interim History: Katrina Larson took a while to get used to the plan and she did not like greek yogurt, so she is doing cheese instead. Katrina Larson is doing eggs mostly, and she is interested in cereal mostly. She is doing the salad option for lunch and 1 slice of bread with cheese (doing some food as a snack). For dinner, patient was doing [employer] with a veggie burger or chicken patty. She is still working on getting all of the food in.  Subjective:   Vitamin D deficiency Katrina Larson's Vitamin D level was 27.0 on 11/15/19. She is on vit D supplementation, that was started at the last appointment. She admits to fatigue.  Prediabetes Katrina Larson has a diagnosis of prediabetes based on her elevated Hgb A1c and was informed this puts her at greater risk of developing diabetes. Her last A1c was at 6.1 (11/04/19) and last insulin level was at 17.6 (11/15/19). Katrina Larson is not on medications, but she was previously on Metformin, and she had GI side effects. Patient is concerned about Metformin recall.  She continues to work on diet and exercise to decrease her risk of diabetes. She denies nausea or hypoglycemia.  Lab Results  Component Value Date   HGBA1C 6.1 11/04/2019   Lab Results  Component Value Date   INSULIN 17.6 11/15/2019   Other hyperlipidemia Katrina Larson has hyperlipidemia and she has LDL of 119 (previously 113). She is not on medications.  Katrina Larson has been trying to improve her cholesterol levels with intensive lifestyle modification including a low saturated fat diet, exercise and weight loss. She denies any chest pain.  Lab Results  Component Value Date   ALT 13 11/04/2019   AST 14 11/04/2019   ALKPHOS 100 11/04/2019   BILITOT 0.3 11/04/2019   Lab Results  Component Value Date   CHOL 186 11/04/2019   HDL 43.50 11/04/2019   LDLCALC 119 (H) 11/04/2019   TRIG 113.0 11/04/2019   CHOLHDL 4 11/04/2019   At risk for diabetes mellitus Katrina Larson is at higher than average risk for developing diabetes due to her obesity and prediabetes.   Assessment/Plan:   Vitamin D deficiency Low Vitamin D level contributes to fatigue and are associated with obesity, breast, and colon cancer. Katrina Larson agrees to continue to take prescription Vitamin D @50 ,000 IU every week and we will recheck labs in 3 months. Katrina Larson will follow-up for routine testing of Vitamin D, at least 2-3 times per year to avoid over-replacement.  Prediabetes  Katrina Larson will continue to work on weight loss, exercise, and decreasing simple carbohydrates to help decrease the risk of diabetes. We will follow up at the next appointment and patient would like to reconsider. She agrees to take metformin 500 mg qAM #30 with no refills.  Other hyperlipidemia Cardiovascular risk and specific lipid/LDL goals reviewed.  We discussed several lifestyle modifications today and Katrina Larson will continue to work on diet, exercise and weight loss  efforts. We will repeat labs in 3 months Orders and follow up as documented in patient record.   Counseling Intensive lifestyle modifications are the first line treatment for this issue. . Dietary changes: Increase soluble fiber. Decrease simple carbohydrates. . Exercise changes: Moderate to vigorous-intensity aerobic activity 150 minutes per week if tolerated. . Lipid-lowering medications: see documented in medical record.  At risk for  diabetes mellitus Katrina Larson was given approximately 15 minutes of diabetes education and counseling today. We discussed intensive lifestyle modifications today with an emphasis on weight loss as well as increasing exercise and decreasing simple carbohydrates in her diet. We also reviewed medication options with an emphasis on risk versus benefit of those discussed.   Repetitive spaced learning was employed today to elicit superior memory formation and behavioral change.  Class 2 severe obesity with serious comorbidity and body mass index (BMI) of 39.0 to 39.9 in adult, unspecified obesity type [facility]) Katrina Larson is currently in the action stage of change. As such, her goal is to continue with weight loss efforts. She has agreed to the [employer] +300 calories.   Exercise goals: Katrina Larson will continue with her current exercise regimen.  Behavioral modification strategies: increasing lean protein intake, increasing vegetables, meal planning and cooking strategies, keeping healthy foods in the home and planning for success.  Katrina Larson has agreed to follow-up with our clinic in 2 weeks. She was informed of the importance of frequent follow-up visits to maximize her success with intensive lifestyle modifications for her multiple health conditions.   Objective:   Blood pressure 118/82, pulse 99, temperature 97.7 F (36.5 C), temperature source Oral, height 5\' 5"  (1.651 m), weight 236 lb (107 kg), SpO2 97 %. Body mass index is 39.27 kg/m.  General: Cooperative, alert, well developed, in no acute distress. HEENT: Conjunctivae and lids unremarkable. Cardiovascular: Regular rhythm.  Lungs: Normal work of breathing. Neurologic: No focal deficits.   Lab Results  Component Value Date   CREATININE 0.82 11/04/2019   BUN 11 11/04/2019   NA 137 11/04/2019   K 3.6 11/04/2019   CL 101 11/04/2019   CO2 26 11/04/2019   Lab Results  Component Value Date   ALT 13 11/04/2019   AST 14 11/04/2019    ALKPHOS 100 11/04/2019   BILITOT 0.3 11/04/2019   Lab Results  Component Value Date   HGBA1C 6.1 11/04/2019   HGBA1C 6.1 01/02/2018   HGBA1C 6.0 08/14/2015   HGBA1C 6.0 02/09/2015   Lab Results  Component Value Date   INSULIN 17.6 11/15/2019   Lab Results  Component Value Date   TSH 1.97 11/04/2019   Lab Results  Component Value Date   CHOL 186 11/04/2019   HDL 43.50 11/04/2019   LDLCALC 119 (H) 11/04/2019   TRIG 113.0 11/04/2019   CHOLHDL 4 11/04/2019   Lab Results  Component Value Date   WBC 9.6 11/04/2019   HGB 11.1 (L) 11/04/2019   HCT 33.3 (L) 11/15/2019   MCV 77.0 (L) 11/04/2019   PLT 324.0 11/04/2019   Lab Results  Component Value Date   IRON 27 11/15/2019   TIBC 346 11/15/2019   FERRITIN 43 11/15/2019    Ref. Range 11/15/2019 14:49  Vitamin D, 25-Hydroxy Latest Ref Range: 30.0 - 100.0 ng/mL 27.0 (L)    Attestation Statements:   Reviewed by clinician on day of visit: allergies, medications, problem list, medical history, surgical history, family history, social history, and previous encounter notes.  Corey Skains, am acting as Location manager for [employer].  Adair Patter, MD.  I have reviewed the above documentation for accuracy and completeness, and I agree with the above. - Ilene Qua, MD

## 2019-12-01 ENCOUNTER — Other Ambulatory Visit: Payer: Self-pay

## 2019-12-01 ENCOUNTER — Encounter: Payer: Self-pay | Admitting: Family Medicine

## 2019-12-01 ENCOUNTER — Ambulatory Visit (INDEPENDENT_AMBULATORY_CARE_PROVIDER_SITE_OTHER): Payer: 59 | Admitting: Family Medicine

## 2019-12-01 VITALS — BP 120/72 | HR 89 | Ht 65.0 in | Wt 239.0 lb

## 2019-12-01 DIAGNOSIS — M999 Biomechanical lesion, unspecified: Secondary | ICD-10-CM

## 2019-12-01 DIAGNOSIS — G8929 Other chronic pain: Secondary | ICD-10-CM

## 2019-12-01 DIAGNOSIS — M545 Low back pain: Secondary | ICD-10-CM | POA: Diagnosis not present

## 2019-12-01 MED ORDER — HYDROXYZINE HCL 10 MG PO TABS: 10.0000 mg | ORAL_TABLET | Freq: Three times a day (TID) | ORAL | 0 refills | Status: AC | PRN

## 2019-12-01 MED ORDER — GABAPENTIN 100 MG PO CAPS: 200.0000 mg | ORAL_CAPSULE | Freq: Every day | ORAL | 3 refills | Status: AC

## 2019-12-01 MED FILL — GABAPENTIN 100 MG CAPSULE: 100 | 30 days supply | Qty: 60 | Fill #0

## 2019-12-01 MED FILL — hydrOXYzine HCL 10 MG TABS: 10 | 10 days supply | Qty: 30 | Fill #0

## 2019-12-01 NOTE — Patient Instructions (Addendum)
Good to see you Exercise 3 times a week Gabapentin 200 mg at night Hydroxyzine as needed See me again in 4-6 weeks

## 2019-12-01 NOTE — Progress Notes (Signed)
[facility] [address] [place] Phone: [phone] Subjective:   [name] am serving as a Education administrator for Dr. Hulan Saas.  This visit occurred during the SARS-CoV-2 public health emergency.  Safety protocols were in place, including screening questions prior to the visit, additional usage of staff PPE, and extensive cleaning of exam room while observing appropriate contact time as indicated for disinfecting solutions.   I'm seeing this patient by the request  of:  Hoyt Koch, MD  CC: Low back pain follow-up  [id number]  Katrina Larson is a 39 y.o. female coming in with complaint of back pain. Last seen in 2016 for OMT. Patient states she is feeling stiff and sore. Patient states this is not a new issue. States she is having sciatic nerve pain that radiates down her right leg.  Has been over 4 years since we have seen patient.  Patient has been doing relatively well but has had a lot of other associated constraints has kept her away.  Patient recently did have an injury at work she feels.  Patient denies any radiation down the leg significantly.  States that it did go into the left buttocks initially but in fact kind of resolved after couple days.  Continues to have pain mostly on the left side of the lumbar spine     Past Medical History:  Diagnosis Date  . Gallstones   . GERD (gastroesophageal reflux disease)   . H/O knee surgery   . Hepatic steatosis   . Obesity   . PCOS (polycystic ovarian syndrome)    Past Surgical History:  Procedure Laterality Date  . APPENDECTOMY    . CHOLECYSTECTOMY N/A 11/01/2016   Procedure: LAPAROSCOPIC CHOLECYSTECTOMY;  Surgeon: Clovis Riley, MD;  Location: [facility];  Service: General;  Laterality: N/A;  . COLONOSCOPY    . KNEE SURGERY Right   . TONSILLECTOMY     Social History   Socioeconomic History  . Marital status: Married    Spouse name: Not on file  . Number of  children: Not on file  . Years of education: Not on file  . Highest education level: Not on file  Occupational History  . Not on file  Tobacco Use  . Smoking status: Never Smoker  . Smokeless tobacco: Never Used  Substance and Sexual Activity  . Alcohol use: Yes    Alcohol/week: 0.0 standard drinks    Comment: rare  . Drug use: No  . Sexual activity: Not on file  Other Topics Concern  . Not on file  Social History Narrative  . Not on file   Social Determinants of Health   Financial Resource Strain:   . Difficulty of Paying Living Expenses: Not on file  Food Insecurity:   . Worried About Charity fundraiser in the Last Year: Not on file  . Ran Out of Food in the Last Year: Not on file  Transportation Needs:   . Lack of Transportation (Medical): Not on file  . Lack of Transportation (Non-Medical): Not on file  Physical Activity:   . Days of Exercise per Week: Not on file  . Minutes of Exercise per Session: Not on file  Stress:   . Feeling of Stress : Not on file  Social Connections:   . Frequency of Communication with Friends and Family: Not on file  . Frequency of Social Gatherings with Friends and Family: Not on file  . Attends Religious Services:  Not on file  . Active Member of Clubs or Organizations: Not on file  . Attends Archivist Meetings: Not on file  . Marital Status: Not on file   Allergies  Allergen Reactions  . Latex Hives and Rash   Family History  Problem Relation Age of Onset  . Thyroid disease Mother   . Sleep apnea Mother   . Obesity Mother   . Alcoholism Father   . Obesity Father   . Drug abuse Father   . Colon cancer Neg Hx   . Esophageal cancer Neg Hx   . Pancreatic cancer Neg Hx   . Stomach cancer Neg Hx   . Liver disease Neg Hx     Current Outpatient Medications (Endocrine & Metabolic):  .  metFORMIN (GLUCOPHAGE) 500 MG tablet, Take 1 tablet (500 mg total) by mouth daily with breakfast.   Current Outpatient Medications  (Respiratory):  .  albuterol (PROVENTIL HFA;VENTOLIN HFA) 108 (90 Base) MCG/ACT inhaler, Inhale 2 puffs into the lungs every 6 (six) hours as needed for wheezing or shortness of breath. .  fluticasone (FLONASE) 50 MCG/ACT nasal spray, Place into both nostrils daily. Marland Kitchen  loratadine-pseudoephedrine (CLARITIN-D 24 HOUR) 10-240 MG 24 hr tablet, Take 1 tablet by mouth daily.   Current Outpatient Medications (Hematological):  .  vitamin B-12 (CYANOCOBALAMIN) 1000 MCG tablet, Take 1,000 mcg by mouth daily.  Current Outpatient Medications (Other):  Marland Kitchen  Multiple Vitamins-Minerals (MULTIVITAMIN WITH MINERALS) tablet, Take 1 tablet by mouth daily. .  Probiotic Product (PROBIOTIC PO), Take 1 capsule by mouth daily. .  Vitamin D, Ergocalciferol, (DRISDOL) 1.25 MG (50000 UNIT) CAPS capsule, Take 1 capsule (50,000 Units total) by mouth every 7 (seven) days. Marland Kitchen  gabapentin (NEURONTIN) 100 MG capsule, Take 2 capsules (200 mg total) by mouth at bedtime. .  hydrOXYzine (ATARAX/VISTARIL) 10 MG tablet, Take 1 tablet (10 mg total) by mouth 3 (three) times daily as needed.   Reviewed prior external information including notes and imaging from  primary care provider As well as notes that were available from care everywhere and other healthcare systems.  Past medical history, social, surgical and family history all reviewed in electronic medical record.  No pertanent information unless stated regarding to the chief complaint.   Review of Systems:  No headache, visual changes, nausea, vomiting, diarrhea, constipation, dizziness, abdominal pain, skin rash, fevers, chills, night sweats, weight loss, swollen lymph nodes, body aches, joint swelling, chest pain, shortness of breath, mood changes. POSITIVE muscle aches  Objective  Blood pressure 120/72, pulse 89, height 5\' 5"  (1.651 m), weight 239 lb (108.4 kg), SpO2 98 %.   General: No apparent distress alert and oriented x3 mood and affect normal, dressed appropriately.   HEENT: Pupils equal, extraocular movements intact  Respiratory: Patient's speak in full sentences and does not appear short of breath  Cardiovascular: No lower extremity edema, non tender, no erythema  Skin: Warm dry intact with no signs of infection or rash on extremities or on axial skeleton.  Abdomen: Soft nontender  Neuro: Cranial nerves II through XII are intact, neurovascularly intact in all extremities with 2+ DTRs and 2+ pulses.  Lymph: No lymphadenopathy of posterior or anterior cervical chain or axillae bilaterally.  Gait normal with good balance and coordination.  MSK:  Non tender with full range of motion and good stability and symmetric strength and tone of shoulders, elbows, wrist, hip, knee and ankles bilaterally.  Back Exam:  Inspection: Mild loss of lordosis Motion: Flexion 45 deg,  Extension 25 deg, Side Bending to 35 deg bilaterally,  Rotation to 45 deg bilaterally  SLR laying: Negative  XSLR laying: Negative  Palpable tenderness: Tender to palpation in the paraspinal musculature in the lumbar spine right greater than left. FABER: Tightness bilaterally.  Worse on the left Sensory change: Gross sensation intact to all lumbar and sacral dermatomes.  Reflexes: 2+ at both patellar tendons, 2+ at achilles tendons, Babinski's downgoing.  Strength at foot  Plantar-flexion: 5/5 Dorsi-flexion: 5/5 Eversion: 5/5 Inversion: 5/5  Leg strength  Quad: 5/5 Hamstring: 5/5 Hip flexor: 4/5 but symmetric hip abductors: 5/5  Gait unremarkable.  Osteopathic findings T9 extended rotated and side bent left L2 flexed rotated and side bent right Sacrum right on right    Impression and Recommendations:     This case required medical decision making of moderate complexity. The above documentation has been reviewed and is accurate and complete Lyndal Pulley, DO       Note: This dictation was prepared with Dragon dictation along with smaller phrase technology. Any transcriptional  errors that result from this process are unintentional.

## 2019-12-02 ENCOUNTER — Encounter: Payer: Self-pay | Admitting: Family Medicine

## 2019-12-02 NOTE — Assessment & Plan Note (Signed)
Low back exam does have some poor core strength.  Patient is very tender to palpation of the paraspinal musculature of the left side with mild spasm noted.  Negative straight leg test, positive FABER test.  Mild pain over the piriformis also noted.  4-5 strength of the hip abductors that seems to be symmetric to the contralateral side patient given gabapentin, home exercise, responded well to manipulation.  Follow-up again in 4 to 8 weeks

## 2019-12-02 NOTE — Assessment & Plan Note (Signed)
Decision today to treat with OMT was based on Physical Exam  After verbal consent patient was treated with HVLA, ME, FPR techniques in cervical, thoracic, rib, lumbar and sacral areas  Patient tolerated the procedure well with improvement in symptoms  Patient given exercises, stretches and lifestyle modifications  See medications in patient instructions if given  Patient will follow up in 4-8 weeks  Social determinants that affect health including patient being single.  The moment and working considerable number of hours and financial constraints

## 2019-12-07 ENCOUNTER — Other Ambulatory Visit (INDEPENDENT_AMBULATORY_CARE_PROVIDER_SITE_OTHER): Payer: Self-pay | Admitting: Family Medicine

## 2019-12-07 DIAGNOSIS — E559 Vitamin D deficiency, unspecified: Secondary | ICD-10-CM

## 2019-12-07 MED ORDER — VITAMIN D (ERGOCALCIFEROL) 1.25 MG (50000 UNIT) PO CAPS: 50000.0000 [IU] | ORAL_CAPSULE | ORAL | 0 refills | Status: DC

## 2019-12-07 MED FILL — VIT D2 1.25 MG (50,000 UNIT: 1.25 MG | 28 days supply | Qty: 4 | Fill #0

## 2019-12-16 ENCOUNTER — Ambulatory Visit: Payer: 59 | Admitting: Internal Medicine

## 2019-12-21 DIAGNOSIS — H5213 Myopia, bilateral: Secondary | ICD-10-CM | POA: Diagnosis not present

## 2019-12-22 ENCOUNTER — Ambulatory Visit (INDEPENDENT_AMBULATORY_CARE_PROVIDER_SITE_OTHER): Payer: 59 | Admitting: Family Medicine

## 2019-12-22 ENCOUNTER — Other Ambulatory Visit: Payer: Self-pay

## 2019-12-22 ENCOUNTER — Encounter (INDEPENDENT_AMBULATORY_CARE_PROVIDER_SITE_OTHER): Payer: Self-pay | Admitting: Family Medicine

## 2019-12-22 VITALS — BP 112/80 | HR 89 | Temp 97.5°F | Ht 65.0 in | Wt 233.0 lb

## 2019-12-22 DIAGNOSIS — R7303 Prediabetes: Secondary | ICD-10-CM

## 2019-12-22 DIAGNOSIS — Z9189 Other specified personal risk factors, not elsewhere classified: Secondary | ICD-10-CM | POA: Diagnosis not present

## 2019-12-22 DIAGNOSIS — E559 Vitamin D deficiency, unspecified: Secondary | ICD-10-CM | POA: Diagnosis not present

## 2019-12-22 DIAGNOSIS — Z6838 Body mass index (BMI) 38.0-38.9, adult: Secondary | ICD-10-CM | POA: Diagnosis not present

## 2019-12-23 NOTE — Progress Notes (Signed)
Chief Complaint:   Katrina Larson is here to discuss her progress with her obesity treatment plan along with follow-up of her obesity related diagnoses. Katrina Larson is on the [employer] and states she is following her eating plan approximately 50% of the time. Katrina Larson states she is doing yoga for 25 to 30 minutes, 2 times per week, and walking 30 minutes 1 to 2 times per week.  Today's visit was #: 3 Starting weight: 237 lbs Starting date: 11/15/2019 Today's weight: 233 lbs Today's date: 12/22/2019 Total lbs lost to date: 4 Total lbs lost since last in-office visit: 3  Interim History: Katrina Larson is not eating enough, though patient does report improvement in lunch. She is getting more water in, but patient reports significant GI side effects of Metformin, that decreases the amount of food she was capable of eating.  Subjective:   Prediabetes Katrina Larson has a diagnosis of prediabetes. She has significant GI side effects of metformin, with cramping and diarrhea. Her last Hgb A1c was 6.1 and last insulin level was 17.6. She continues to work on diet and exercise to decrease her risk of diabetes.   Lab Results  Component Value Date   HGBA1C 6.1 11/04/2019   Lab Results  Component Value Date   INSULIN 17.6 11/15/2019   Vitamin D deficiency Katrina Larson's last vitamin D level was 27.0 on 11/15/19. She is currently taking prescription vit D. She admits fatigue and denies nausea, vomiting or muscle weakness.  At risk for diabetes mellitus Katrina Larson is at higher than average risk for developing diabetes due to her obesity and prediabetes.   Assessment/Plan:   Prediabetes Katrina Larson agree to stop metformin and she will continue to work on weight loss, exercise, and decreasing simple carbohydrates to help decrease the risk of diabetes. We will repeat labs in 2 months.  Vitamin D deficiency Low Vitamin D level contributes to fatigue and are associated with obesity, breast, and  colon cancer. Katrina Larson will continue to take prescription Vitamin D @50 ,000 IU every week (no refill needed) and she will follow-up for routine testing of Vitamin D, at least 2-3 times per year to avoid over-replacement.  At risk for diabetes mellitus Katrina Larson was given approximately 15 minutes of diabetes education and counseling today. We discussed intensive lifestyle modifications today with an emphasis on weight loss as well as increasing exercise and decreasing simple carbohydrates in her diet. We also reviewed medication options with an emphasis on risk versus benefit of those discussed.   Repetitive spaced learning was employed today to elicit superior memory formation and behavioral change.  Class 2 severe obesity with serious comorbidity and body mass index (BMI) of 38.0 to 38.9 in adult, unspecified obesity type Katrina Larson) Katrina Larson is currently in the action stage of change. As such, her goal is to continue with weight loss efforts. She has agreed to the [employer] +300 calories.   Exercise goals: Katrina Larson will continue her current exercise regimen.  Behavioral modification strategies: increasing lean protein intake, increasing vegetables, meal planning and cooking strategies, keeping healthy foods in the home and planning for success.  Katrina Larson has agreed to follow-up with our clinic in 2 weeks. She was informed of the importance of frequent follow-up visits to maximize her success with intensive lifestyle modifications for her multiple health conditions.   Objective:   Blood pressure 112/80, pulse 89, temperature (!) 97.5 F (36.4 C), temperature source Oral, height 5\' 5"  (1.651 m), weight 233 lb (105.7 kg), last menstrual period 11/30/2019,  SpO2 100 %. Body mass index is 38.77 kg/m.  General: Cooperative, alert, well developed, in no acute distress. HEENT: Conjunctivae and lids unremarkable. Cardiovascular: Regular rhythm.  Lungs: Normal work of breathing. Neurologic: No  focal deficits.   Lab Results  Component Value Date   CREATININE 0.82 11/04/2019   BUN 11 11/04/2019   NA 137 11/04/2019   K 3.6 11/04/2019   CL 101 11/04/2019   CO2 26 11/04/2019   Lab Results  Component Value Date   ALT 13 11/04/2019   AST 14 11/04/2019   ALKPHOS 100 11/04/2019   BILITOT 0.3 11/04/2019   Lab Results  Component Value Date   HGBA1C 6.1 11/04/2019   HGBA1C 6.1 01/02/2018   HGBA1C 6.0 08/14/2015   HGBA1C 6.0 02/09/2015   Lab Results  Component Value Date   INSULIN 17.6 11/15/2019   Lab Results  Component Value Date   TSH 1.97 11/04/2019   Lab Results  Component Value Date   CHOL 186 11/04/2019   HDL 43.50 11/04/2019   LDLCALC 119 (H) 11/04/2019   TRIG 113.0 11/04/2019   CHOLHDL 4 11/04/2019   Lab Results  Component Value Date   WBC 9.6 11/04/2019   HGB 11.1 (L) 11/04/2019   HCT 33.3 (L) 11/15/2019   MCV 77.0 (L) 11/04/2019   PLT 324.0 11/04/2019   Lab Results  Component Value Date   IRON 27 11/15/2019   TIBC 346 11/15/2019   FERRITIN 43 11/15/2019    Ref. Range 11/15/2019 14:49  Vitamin D, 25-Hydroxy Latest Ref Range: 30.0 - 100.0 ng/mL 27.0 (L)    Attestation Statements:   Reviewed by clinician on day of visit: allergies, medications, problem list, medical history, surgical history, family history, social history, and previous encounter notes.  I, Doreene Nest, am acting as transcriptionist for Coralie Common, MD.  I have reviewed the above documentation for accuracy and completeness, and I agree with the above. - Ilene Qua, MD

## 2020-01-04 ENCOUNTER — Other Ambulatory Visit (INDEPENDENT_AMBULATORY_CARE_PROVIDER_SITE_OTHER): Payer: Self-pay | Admitting: Family Medicine

## 2020-01-04 DIAGNOSIS — E559 Vitamin D deficiency, unspecified: Secondary | ICD-10-CM

## 2020-01-06 ENCOUNTER — Encounter: Payer: Self-pay | Admitting: Family Medicine

## 2020-01-06 ENCOUNTER — Ambulatory Visit (INDEPENDENT_AMBULATORY_CARE_PROVIDER_SITE_OTHER): Payer: 59 | Admitting: Family Medicine

## 2020-01-06 ENCOUNTER — Other Ambulatory Visit: Payer: Self-pay

## 2020-01-06 VITALS — BP 110/78 | HR 106 | Ht 65.0 in | Wt 242.0 lb

## 2020-01-06 DIAGNOSIS — M545 Low back pain: Secondary | ICD-10-CM

## 2020-01-06 DIAGNOSIS — M999 Biomechanical lesion, unspecified: Secondary | ICD-10-CM | POA: Diagnosis not present

## 2020-01-06 DIAGNOSIS — G8929 Other chronic pain: Secondary | ICD-10-CM

## 2020-01-06 NOTE — Progress Notes (Signed)
[facility] [place] Phone: [phone] Subjective:   [name], am serving as a scribe for Dr. Hulan Saas. This visit occurred during the SARS-CoV-2 public health emergency.  Safety protocols were in place, including screening questions prior to the visit, additional usage of staff PPE, and extensive cleaning of exam room while observing appropriate contact time as indicated for disinfecting solutions.    I'm seeing this patient by the request  of:  Hoyt Koch, MD  CC: Low back pain follow-up  Subjective    Katrina Larson is a 39 y.o. female coming in with complaint of back pain. Last seen on 12/01/2019 for OMT. Patient states that she has been sitting on the ball and it helps alleviate tightness in the piriformis as time. Stretching does help alleviate her pain. Has lower back and neck pain in the mornings.  Patient has been doing a little more lifting at work and thinks is contributing to some of the aches and pains.       Past Medical History:  Diagnosis Date  . Gallstones   . GERD (gastroesophageal reflux disease)   . H/O knee surgery   . Hepatic steatosis   . Obesity   . PCOS (polycystic ovarian syndrome)    Past Surgical History:  Procedure Laterality Date  . APPENDECTOMY    . CHOLECYSTECTOMY N/A 11/01/2016   Procedure: LAPAROSCOPIC CHOLECYSTECTOMY;  Surgeon: Clovis Riley, MD;  Location: [facility];  Service: General;  Laterality: N/A;  . COLONOSCOPY    . KNEE SURGERY Right   . TONSILLECTOMY     Social History   Socioeconomic History  . Marital status: Widowed    Spouse name: Not on file  . Number of children: Not on file  . Years of education: Not on file  . Highest education level: Not on file  Occupational History  . Not on file  Tobacco Use  . Smoking status: Never Smoker  . Smokeless tobacco: Never Used  Substance and Sexual Activity  . Alcohol use: Yes    Alcohol/week:  0.0 standard drinks    Comment: rare  . Drug use: No  . Sexual activity: Not on file  Other Topics Concern  . Not on file  Social History Narrative  . Not on file   Social Determinants of Health   Financial Resource Strain:   . Difficulty of Paying Living Expenses:   Food Insecurity:   . Worried About Charity fundraiser in the Last Year:   . Arboriculturist in the Last Year:   Transportation Needs:   . Film/video editor (Medical):   Marland Kitchen Lack of Transportation (Non-Medical):   Physical Activity:   . Days of Exercise per Week:   . Minutes of Exercise per Session:   Stress:   . Feeling of Stress :   Social Connections:   . Frequency of Communication with Friends and Family:   . Frequency of Social Gatherings with Friends and Family:   . Attends Religious Services:   . Active Member of Clubs or Organizations:   . Attends Archivist Meetings:   Marland Kitchen Marital Status:    Allergies  Allergen Reactions  . Latex Hives and Rash   Family History  Problem Relation Age of Onset  . Thyroid disease Mother   . Sleep apnea Mother   . Obesity Mother   . Alcoholism Father   . Obesity Father   .  Drug abuse Father   . Colon cancer Neg Hx   . Esophageal cancer Neg Hx   . Pancreatic cancer Neg Hx   . Stomach cancer Neg Hx   . Liver disease Neg Hx       Current Outpatient Medications (Respiratory):  .  albuterol (PROVENTIL HFA;VENTOLIN HFA) 108 (90 Base) MCG/ACT inhaler, Inhale 2 puffs into the lungs every 6 (six) hours as needed for wheezing or shortness of breath. .  fluticasone (FLONASE) 50 MCG/ACT nasal spray, Place into both nostrils daily. Marland Kitchen  loratadine-pseudoephedrine (CLARITIN-D 24 HOUR) 10-240 MG 24 hr tablet, Take 1 tablet by mouth daily.   Current Outpatient Medications (Hematological):  .  vitamin B-12 (CYANOCOBALAMIN) 1000 MCG tablet, Take 1,000 mcg by mouth daily.  Current Outpatient Medications (Other):  .  gabapentin (NEURONTIN) 100 MG capsule, Take 2  capsules (200 mg total) by mouth at bedtime. .  hydrOXYzine (ATARAX/VISTARIL) 10 MG tablet, Take 1 tablet (10 mg total) by mouth 3 (three) times daily as needed. .  Multiple Vitamins-Minerals (MULTIVITAMIN WITH MINERALS) tablet, Take 1 tablet by mouth daily. .  Probiotic Product (PROBIOTIC PO), Take 1 capsule by mouth daily. .  Vitamin D, Ergocalciferol, (DRISDOL) 1.25 MG (50000 UNIT) CAPS capsule, Take 1 capsule (50,000 Units total) by mouth every 7 (seven) days.   Reviewed prior external information including notes and imaging from  primary care provider As well as notes that were available from care everywhere and other healthcare systems.  Past medical history, social, surgical and family history all reviewed in electronic medical record.  No pertanent information unless stated regarding to the chief complaint.   Review of Systems:  No headache, visual changes, nausea, vomiting, diarrhea, constipation, dizziness, abdominal pain, skin rash, fevers, chills, night sweats, weight loss, swollen lymph nodes, body aches, joint swelling, chest pain, shortness of breath, mood changes. POSITIVE muscle aches  Objective  Blood pressure 110/78, pulse (!) 106, height 5\' 5"  (1.651 m), weight 242 lb (109.8 kg), SpO2 98 %.   General: No apparent distress alert and oriented x3 mood and affect normal, dressed appropriately.  Overweight HEENT: Pupils equal, extraocular movements intact  Respiratory: Patient's speak in full sentences and does not appear short of breath  Cardiovascular: No lower extremity edema, non tender, no erythema  Skin: Warm dry intact with no signs of infection or rash on extremities or on axial skeleton.  Abdomen: Soft nontender  Neuro: Cranial nerves II through XII are intact, neurovascularly intact in all extremities with 2+ DTRs and 2+ pulses.  Lymph: No lymphadenopathy of posterior or anterior cervical chain or axillae bilaterally.  Gait normal with good balance and  coordination.  MSK:  tender with full range of motion and good stability and symmetric strength and tone of shoulders, elbows, wrist, hip, knee and ankles bilaterally.  Neck exam does have some mild loss of lordosis.  Tender to palpation in the paraspinal musculature lumbar spine right greater than left.  Negative straight leg test but tightness of the hamstrings.  Mild tightness with Corky Sox test bilaterally as well.  No weakness of the lower extremities and deep tendon reflexes appear to be intact  Neck exam does have some mild loss of lordosis.  Negative Spurling's.  Tightness in the parascapular region right greater than left.  Full range of motion though noted and no significant dyskinesis.  Osteopathic findings C4 flexed rotated and side bent left C6 flexed rotated and side bent right  T4 extended rotated and side bent right inhaled third  rib T9 extended rotated and side bent left L2 flexed rotated and side bent right Sacrum right on right    Impression and Recommendations:     This case required medical decision making of moderate complexity. The above documentation has been reviewed and is accurate and complete Lyndal Pulley, DO       Note: This dictation was prepared with Dragon dictation along with smaller phrase technology. Any transcriptional errors that result from this process are unintentional.

## 2020-01-06 NOTE — Patient Instructions (Signed)
Rotate mattress Try to cut down on mm relaxer See me in 4-6 weeks

## 2020-01-07 ENCOUNTER — Encounter: Payer: Self-pay | Admitting: Family Medicine

## 2020-01-07 DIAGNOSIS — M999 Biomechanical lesion, unspecified: Secondary | ICD-10-CM | POA: Insufficient documentation

## 2020-01-07 NOTE — Assessment & Plan Note (Signed)
Decision today to treat with OMT was based on Physical Exam  After verbal consent patient was treated with HVLA, ME, FPR techniques in cervical, thoracic, rib,  lumbar and sacral areas  Patient tolerated the procedure well with improvement in symptoms  Patient given exercises, stretches and lifestyle modifications  See medications in patient instructions if given  Patient will follow up in 4-8 weeks 

## 2020-01-07 NOTE — Assessment & Plan Note (Signed)
Chronic problem with mild exacerbation.  Discussed posture and ergonomics, discussed which activities to doing which wants to avoid.  Patient will increase activity slowly.  Follow-up with me again in 4 to 8 weeks

## 2020-01-10 DIAGNOSIS — Z6839 Body mass index (BMI) 39.0-39.9, adult: Secondary | ICD-10-CM | POA: Diagnosis not present

## 2020-01-10 DIAGNOSIS — Z01419 Encounter for gynecological examination (general) (routine) without abnormal findings: Secondary | ICD-10-CM | POA: Diagnosis not present

## 2020-01-13 ENCOUNTER — Other Ambulatory Visit (INDEPENDENT_AMBULATORY_CARE_PROVIDER_SITE_OTHER): Payer: Self-pay | Admitting: Family Medicine

## 2020-01-13 ENCOUNTER — Encounter (INDEPENDENT_AMBULATORY_CARE_PROVIDER_SITE_OTHER): Payer: Self-pay | Admitting: Family Medicine

## 2020-01-13 ENCOUNTER — Other Ambulatory Visit: Payer: Self-pay

## 2020-01-13 ENCOUNTER — Ambulatory Visit (INDEPENDENT_AMBULATORY_CARE_PROVIDER_SITE_OTHER): Payer: 59 | Admitting: Family Medicine

## 2020-01-13 VITALS — BP 102/76 | HR 89 | Temp 98.0°F | Ht 65.0 in | Wt 237.0 lb

## 2020-01-13 DIAGNOSIS — R7303 Prediabetes: Secondary | ICD-10-CM | POA: Diagnosis not present

## 2020-01-13 DIAGNOSIS — Z9189 Other specified personal risk factors, not elsewhere classified: Secondary | ICD-10-CM

## 2020-01-13 DIAGNOSIS — Z6839 Body mass index (BMI) 39.0-39.9, adult: Secondary | ICD-10-CM

## 2020-01-13 DIAGNOSIS — E559 Vitamin D deficiency, unspecified: Secondary | ICD-10-CM

## 2020-01-13 MED ORDER — VITAMIN D (ERGOCALCIFEROL) 1.25 MG (50000 UNIT) PO CAPS: 50000.0000 [IU] | ORAL_CAPSULE | ORAL | 0 refills | Status: DC

## 2020-01-13 MED FILL — GABAPENTIN 100 MG CAPSULE: 100 | 30 days supply | Qty: 60 | Fill #1

## 2020-01-13 MED FILL — VIT D2 1.25 MG (50,000 UNIT: 1.25 MG | 28 days supply | Qty: 4 | Fill #0

## 2020-01-14 NOTE — Progress Notes (Signed)
Chief Complaint:   OBESITY Katrina Larson is here to discuss her progress with her obesity treatment plan along with follow-up of her obesity related diagnoses. Katrina Larson is on the [employer] + 300 calories and states she is following her eating plan approximately 65% of the time. Katrina Larson states she is doing yoga for 35 minutes, and walking for 45 minutes 2 times per week.  Today's visit was #: 4 Starting weight: 237 lbs Starting date: 11/15/2019 Today's weight: 237 lbs Today's date: 01/13/2020 Total lbs lost to date: 0 Total lbs lost since last in-office visit: 0  Interim History: Katrina Larson had a good deal of stress the past few weeks. She either didn't eat or she didn't follow the meal plan. She also has a fairly significant migraine. She says often when she's stressed she wont eat.  Subjective:   1. Pre-diabetes Katrina Larson's last Hgb A1c was 6.1 and insulin of 17.6. She is not on metformin.  2. Vitamin D deficiency Katrina Larson denies nausea, vomiting, or muscle weakness, but she notes fatigue. Last Vit D level was 27.0.  3. At risk for diabetes mellitus Katrina Larson is at higher than average risk for developing diabetes due to her obesity.   Assessment/Plan:   1. Pre-diabetes Katrina Larson will continue to work on weight loss, exercise, and decreasing simple carbohydrates to help decrease the risk of diabetes. We will repeat labs in early May.  2. Vitamin D deficiency Low Vitamin D level contributes to fatigue and are associated with obesity, breast, and colon cancer. We will refill prescription Vitamin D for 1 month. Katrina Larson will follow-up for routine testing of Vitamin D, at least 2-3 times per year to avoid over-replacement.  - Vitamin D, Ergocalciferol, (DRISDOL) 1.25 MG (50000 UNIT) CAPS capsule; Take 1 capsule (50,000 Units total) by mouth every 7 (seven) days.  Dispense: 4 capsule; Refill: 0  3. At risk for diabetes mellitus Katrina Larson was given approximately 15 minutes of  diabetes education and counseling today. We discussed intensive lifestyle modifications today with an emphasis on weight loss as well as increasing exercise and decreasing simple carbohydrates in her diet. We also reviewed medication options with an emphasis on risk versus benefit of those discussed.   Repetitive spaced learning was employed today to elicit superior memory formation and behavioral change.  4. Class 2 severe obesity with serious comorbidity and body mass index (BMI) of 39.0 to 39.9 in adult, unspecified obesity type (HCC) Katrina Larson is currently in the action stage of change. As such, her goal is to continue with weight loss efforts. She has agreed to the [employer] + 300 calories.   Exercise goals: No exercise has been prescribed at this time.  Behavioral modification strategies: increasing lean protein intake, increasing vegetables, meal planning and cooking strategies, keeping healthy foods in the home and planning for success.  Katrina Larson has agreed to follow-up with our clinic in 2 weeks. She was informed of the importance of frequent follow-up visits to maximize her success with intensive lifestyle modifications for her multiple health conditions.   Objective:   Blood pressure 102/76, pulse 89, temperature 98 F (36.7 C), temperature source Oral, height 5\' 5"  (1.651 m), weight 237 lb (107.5 kg), last menstrual period 11/30/2019, SpO2 96 %. Body mass index is 39.44 kg/m.  General: Cooperative, alert, well developed, in no acute distress. HEENT: Conjunctivae and lids unremarkable. Cardiovascular: Regular rhythm.  Lungs: Normal work of breathing. Neurologic: No focal deficits.   Lab Results  Component Value Date   CREATININE  0.82 11/04/2019   BUN 11 11/04/2019   NA 137 11/04/2019   K 3.6 11/04/2019   CL 101 11/04/2019   CO2 26 11/04/2019   Lab Results  Component Value Date   ALT 13 11/04/2019   AST 14 11/04/2019   ALKPHOS 100 11/04/2019   BILITOT 0.3  11/04/2019   Lab Results  Component Value Date   HGBA1C 6.1 11/04/2019   HGBA1C 6.1 01/02/2018   HGBA1C 6.0 08/14/2015   HGBA1C 6.0 02/09/2015   Lab Results  Component Value Date   INSULIN 17.6 11/15/2019   Lab Results  Component Value Date   TSH 1.97 11/04/2019   Lab Results  Component Value Date   CHOL 186 11/04/2019   HDL 43.50 11/04/2019   LDLCALC 119 (H) 11/04/2019   TRIG 113.0 11/04/2019   CHOLHDL 4 11/04/2019   Lab Results  Component Value Date   WBC 9.6 11/04/2019   HGB 11.1 (L) 11/04/2019   HCT 33.3 (L) 11/15/2019   MCV 77.0 (L) 11/04/2019   PLT 324.0 11/04/2019   Lab Results  Component Value Date   IRON 27 11/15/2019   TIBC 346 11/15/2019   FERRITIN 43 11/15/2019   Attestation Statements:   Reviewed by clinician on day of visit: allergies, medications, problem list, medical history, surgical history, family history, social history, and previous encounter notes.   I, Trixie Dredge, am acting as transcriptionist for Ilene Qua, MD.  I have reviewed the above documentation for accuracy and completeness, and I agree with the above. - Ilene Qua, MD

## 2020-02-02 DIAGNOSIS — Z30430 Encounter for insertion of intrauterine contraceptive device: Secondary | ICD-10-CM | POA: Diagnosis not present

## 2020-02-08 ENCOUNTER — Other Ambulatory Visit (INDEPENDENT_AMBULATORY_CARE_PROVIDER_SITE_OTHER): Payer: Self-pay | Admitting: Family Medicine

## 2020-02-08 ENCOUNTER — Encounter (INDEPENDENT_AMBULATORY_CARE_PROVIDER_SITE_OTHER): Payer: Self-pay

## 2020-02-08 DIAGNOSIS — E559 Vitamin D deficiency, unspecified: Secondary | ICD-10-CM

## 2020-02-08 NOTE — Progress Notes (Signed)
Virtual Visit via Video Note  I connected with Katrina Larson on 02/09/20 at 11:00 AM EDT by a video enabled telemedicine application and verified that I am speaking with the correct person using two identifiers.   I discussed the limitations of evaluation and management by telemedicine and the availability of in person appointments. The patient expressed understanding and agreed to proceed.  Present for the visit:  Myself, Dr Billey Gosling, Jeralyn Bennett.  The patient is currently at work and I am in the office.    No referring provider.    History of Present Illness: She is here for an acute visit for scratchy throat, hoarseness.   Her symptoms 10-14 days ago. She does have seasonal allergies and that is driving part of her symptoms. She is also having more issues because she is wearing her mask all day and there was concern that she might be allergic to some component in the mask. She has been taking her Claritin-D, Flonase and Benadryl at night.  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 She has also had some headaches. Medications she is taking did not seem to be helping much and she is wondering what else she can do. She did use a friend's inhaler the other day and that seemed to help.   Review of Systems  Constitutional: Positive for malaise/fatigue. Negative for chills and fever.  HENT: Positive for congestion, hearing loss (dec in R) and sinus pain. Negative for ear pain and sore throat.        Hoarseness, PND  Respiratory: Positive for cough and wheezing (couple of times). Negative for shortness of breath.        Tightness  Neurological: Positive for headaches.      Social History   Socioeconomic History  . Marital  status: Widowed    Spouse name: Not on file  . Number of children: Not on file  . Years of education: Not on file  . Highest education level: Not on file  Occupational History  . Not on file  Tobacco Use  . Smoking status: Never Smoker  . Smokeless tobacco: Never Used  Substance and Sexual Activity  . Alcohol use: Yes    Alcohol/week: 0.0 standard drinks    Comment: rare  . Drug use: No  . Sexual activity: Not on file  Other Topics Concern  . Not on file  Social History Narrative  . Not on file   Social Determinants of Health   Financial Resource Strain:   . Difficulty of Paying Living Expenses:   Food Insecurity:   . Worried About Charity fundraiser in the Last Year:   . Arboriculturist in the Last Year:   Transportation Needs:   . Film/video editor (Medical):   Marland Kitchen Lack of Transportation (Non-Medical):   Physical Activity:   . Days of Exercise per Week:   . Minutes of Exercise per Session:   Stress:   . Feeling of Stress :   Social Connections:   . Frequency of Communication with Friends and Family:   . Frequency of Social Gatherings with Friends and Family:   . Attends Religious Services:   . Active Member of Clubs or Organizations:   . Attends Archivist Meetings:   .  Marital Status:      Observations/Objective: Appears well in NAD Voice is hoarse Breathing normally, speaking in full sentences Skin appears warm and dry  Assessment and Plan:  See Problem List for Assessment and Plan of chronic medical problems.   Follow Up Instructions:    I discussed the assessment and treatment plan with the patient. The patient was provided an opportunity to ask questions and all were answered. The patient agreed with the plan and demonstrated an understanding of the instructions.   The patient was advised to call back or seek an in-person evaluation if the symptoms worsen or if the condition fails to improve as anticipated.    Binnie Rail, MD

## 2020-02-09 ENCOUNTER — Ambulatory Visit (INDEPENDENT_AMBULATORY_CARE_PROVIDER_SITE_OTHER): Payer: 59 | Admitting: Internal Medicine

## 2020-02-09 ENCOUNTER — Encounter: Payer: Self-pay | Admitting: Internal Medicine

## 2020-02-09 DIAGNOSIS — J4521 Mild intermittent asthma with (acute) exacerbation: Secondary | ICD-10-CM | POA: Diagnosis not present

## 2020-02-09 DIAGNOSIS — J019 Acute sinusitis, unspecified: Secondary | ICD-10-CM | POA: Diagnosis not present

## 2020-02-09 DIAGNOSIS — J029 Acute pharyngitis, unspecified: Secondary | ICD-10-CM | POA: Diagnosis not present

## 2020-02-09 DIAGNOSIS — J45909 Unspecified asthma, uncomplicated: Secondary | ICD-10-CM | POA: Insufficient documentation

## 2020-02-09 MED ORDER — METHYLPREDNISOLONE 4 MG PO TBPK: ORAL_TABLET | ORAL | 0 refills | Status: AC

## 2020-02-09 MED ORDER — ALBUTEROL SULFATE HFA 108 (90 BASE) MCG/ACT IN AERS: 2.0000 | INHALATION_SPRAY | Freq: Four times a day (QID) | RESPIRATORY_TRACT | 5 refills | Status: AC | PRN

## 2020-02-09 MED ORDER — AMOXICILLIN-POT CLAVULANATE 875-125 MG PO TABS: 1.0000 | ORAL_TABLET | Freq: Two times a day (BID) | ORAL | 0 refills | Status: AC

## 2020-02-09 MED FILL — GABAPENTIN 100 MG CAPSULE: 100 | 30 days supply | Qty: 60 | Fill #2

## 2020-02-09 MED FILL — METHYLPREDNISOLONE 4 MG TBP: 4 | 6 days supply | Qty: 21 | Fill #0

## 2020-02-09 MED FILL — ALBUTEROL SULFATE HFA 108 (: 108 (90 BAS | 25 days supply | Qty: 18 | Fill #0

## 2020-02-09 MED FILL — AMOX-CLAV 875-125 MG TABLET: 875-125 | 10 days supply | Qty: 20 | Fill #0

## 2020-02-09 NOTE — Assessment & Plan Note (Signed)
Acute Sounds like she has an acute sinus infection on top of bad allergies We will treat with Augmentin twice daily x10 days Continue allergy medications Medrol Dosepak for sinus pressure and reactive airway disease Continue allergy medications-Claritin-D, Flonase, Benadryl at night Call if no improvement

## 2020-02-09 NOTE — Assessment & Plan Note (Signed)
Acute Current sinus infection Albuterol inhaler as needed Medrol Dosepak

## 2020-02-10 ENCOUNTER — Encounter: Payer: Self-pay | Admitting: Family Medicine

## 2020-02-10 ENCOUNTER — Ambulatory Visit (INDEPENDENT_AMBULATORY_CARE_PROVIDER_SITE_OTHER): Payer: 59 | Admitting: Family Medicine

## 2020-02-10 ENCOUNTER — Other Ambulatory Visit: Payer: Self-pay

## 2020-02-10 VITALS — BP 134/84 | HR 99 | Ht 65.0 in | Wt 240.0 lb

## 2020-02-10 DIAGNOSIS — M545 Low back pain, unspecified: Secondary | ICD-10-CM

## 2020-02-10 DIAGNOSIS — J019 Acute sinusitis, unspecified: Secondary | ICD-10-CM | POA: Diagnosis not present

## 2020-02-10 DIAGNOSIS — M2550 Pain in unspecified joint: Secondary | ICD-10-CM

## 2020-02-10 DIAGNOSIS — M999 Biomechanical lesion, unspecified: Secondary | ICD-10-CM | POA: Diagnosis not present

## 2020-02-10 DIAGNOSIS — U071 COVID-19: Secondary | ICD-10-CM | POA: Diagnosis not present

## 2020-02-10 DIAGNOSIS — G8929 Other chronic pain: Secondary | ICD-10-CM | POA: Diagnosis not present

## 2020-02-10 LAB — COMPREHENSIVE METABOLIC PANEL
ALT: 15 U/L (ref 0–35)
AST: 15 U/L (ref 0–37)
Albumin: 4.6 g/dL (ref 3.5–5.2)
Alkaline Phosphatase: 104 U/L (ref 39–117)
BUN: 9 mg/dL (ref 6–23)
CO2: 25 mEq/L (ref 19–32)
Calcium: 9.2 mg/dL (ref 8.4–10.5)
Chloride: 102 mEq/L (ref 96–112)
Creatinine, Ser: 0.8 mg/dL (ref 0.40–1.20)
GFR: 79.7 mL/min (ref >60.00)
Glucose, Bld: 128 mg/dL — ABNORMAL HIGH (ref 70–99)
Potassium: 3.9 mEq/L (ref 3.5–5.1)
Sodium: 136 mEq/L (ref 135–145)
Total Bilirubin: 0.3 mg/dL (ref 0.2–1.2)
Total Protein: 8.3 g/dL (ref 6.0–8.3)

## 2020-02-10 LAB — CBC WITH DIFFERENTIAL/PLATELET
Basophils Absolute: 0 10*3/uL (ref 0.0–0.1)
Basophils Relative: 0.3 % (ref 0.0–3.0)
Eosinophils Absolute: 0 10*3/uL (ref 0.0–0.7)
Eosinophils Relative: 0 % (ref 0.0–5.0)
HCT: 35.5 % — ABNORMAL LOW (ref 36.0–46.0)
Hemoglobin: 11.3 g/dL — ABNORMAL LOW (ref 12.0–15.0)
Lymphocytes Relative: 10.3 % — ABNORMAL LOW (ref 12.0–46.0)
Lymphs Abs: 1 10*3/uL (ref 0.7–4.0)
MCHC: 32 g/dL (ref 30.0–36.0)
MCV: 79 fl (ref 78.0–100.0)
Monocytes Absolute: 0.1 10*3/uL (ref 0.1–1.0)
Monocytes Relative: 1.4 % — ABNORMAL LOW (ref 3.0–12.0)
Neutro Abs: 8.5 10*3/uL — ABNORMAL HIGH (ref 1.4–7.7)
Neutrophils Relative %: 88 % — ABNORMAL HIGH (ref 43.0–77.0)
Platelets: 336 10*3/uL (ref 150.0–400.0)
RBC: 4.49 Mil/uL (ref 3.87–5.11)
RDW: 17.4 % — ABNORMAL HIGH (ref 11.5–15.5)
WBC: 9.7 10*3/uL (ref 4.0–10.5)

## 2020-02-10 NOTE — Patient Instructions (Addendum)
Good to see you Labs today See me again in 4 weeks

## 2020-02-10 NOTE — Assessment & Plan Note (Signed)

## 2020-02-10 NOTE — Progress Notes (Signed)
[facility] [address] [place] Phone: [phone] Subjective:   I Katrina Larson am serving as a Education administrator for Dr. Hulan Saas.  This visit occurred during the SARS-CoV-2 public health emergency.  Safety protocols were in place, including screening questions prior to the visit, additional usage of staff PPE, and extensive cleaning of exam room while observing appropriate contact time as indicated for disinfecting solutions.   I'm seeing this patient by the request  of:  Hoyt Koch, MD  CC: Low back pain follow-up  [id number]  Katrina Larson is a 39 y.o. female coming in with complaint of back pain. Last seen on 01/06/2020 for OMT. Patient states she feels "out of wack" today. States she is having joint pain that is constant. Believes it might be stress related.  Patient has been stressed out recently.  Also not feeling good.  Is on chronic prednisone at the moment secondary to a sinus infection.  Feels like she is not getting better and if anything her shortness of breath is worsening.     Past Medical History:  Diagnosis Date  . Gallstones   . GERD (gastroesophageal reflux disease)   . H/O knee surgery   . Hepatic steatosis   . Obesity   . PCOS (polycystic ovarian syndrome)    Past Surgical History:  Procedure Laterality Date  . APPENDECTOMY    . CHOLECYSTECTOMY N/A 11/01/2016   Procedure: LAPAROSCOPIC CHOLECYSTECTOMY;  Surgeon: Clovis Riley, MD;  Location: [facility];  Service: General;  Laterality: N/A;  . COLONOSCOPY    . KNEE SURGERY Right   . TONSILLECTOMY     Social History   Socioeconomic History  . Marital status: Widowed    Spouse name: Not on file  . Number of children: Not on file  . Years of education: Not on file  . Highest education level: Not on file  Occupational History  . Not on file  Tobacco Use  . Smoking status: Never Smoker  . Smokeless tobacco: Never Used  Substance and Sexual  Activity  . Alcohol use: Yes    Alcohol/week: 0.0 standard drinks    Comment: rare  . Drug use: No  . Sexual activity: Not on file  Other Topics Concern  . Not on file  Social History Narrative  . Not on file   Social Determinants of Health   Financial Resource Strain:   . Difficulty of Paying Living Expenses:   Food Insecurity:   . Worried About Charity fundraiser in the Last Year:   . Arboriculturist in the Last Year:   Transportation Needs:   . Film/video editor (Medical):   Marland Kitchen Lack of Transportation (Non-Medical):   Physical Activity:   . Days of Exercise per Week:   . Minutes of Exercise per Session:   Stress:   . Feeling of Stress :   Social Connections:   . Frequency of Communication with Friends and Family:   . Frequency of Social Gatherings with Friends and Family:   . Attends Religious Services:   . Active Member of Clubs or Organizations:   . Attends Archivist Meetings:   Marland Kitchen Marital Status:    Allergies  Allergen Reactions  . Latex Hives and Rash   Family History  Problem Relation Age of Onset  . Thyroid disease Mother   . Sleep apnea Mother   . Obesity Mother   . Alcoholism Father   .  Obesity Father   . Drug abuse Father   . Colon cancer Neg Hx   . Esophageal cancer Neg Hx   . Pancreatic cancer Neg Hx   . Stomach cancer Neg Hx   . Liver disease Neg Hx     Current Outpatient Medications (Endocrine & Metabolic):  .  methylPREDNISolone (MEDROL DOSEPAK) 4 MG TBPK tablet, 24 mg PO on day 1, then decr. by 4 mg/day x5 days   Current Outpatient Medications (Respiratory):  .  albuterol (VENTOLIN HFA) 108 (90 Base) MCG/ACT inhaler, Inhale 2 puffs into the lungs every 6 (six) hours as needed for wheezing or shortness of breath. .  fluticasone (FLONASE) 50 MCG/ACT nasal spray, Place into both nostrils daily. Marland Kitchen  loratadine-pseudoephedrine (CLARITIN-D 24 HOUR) 10-240 MG 24 hr tablet, Take 1 tablet by mouth daily.   Current Outpatient  Medications (Hematological):  .  vitamin B-12 (CYANOCOBALAMIN) 1000 MCG tablet, Take 1,000 mcg by mouth daily.  Current Outpatient Medications (Other):  .  amoxicillin-clavulanate (AUGMENTIN) 875-125 MG tablet, Take 1 tablet by mouth every 12 (twelve) hours. .  gabapentin (NEURONTIN) 100 MG capsule, Take 2 capsules (200 mg total) by mouth at bedtime. .  hydrOXYzine (ATARAX/VISTARIL) 10 MG tablet, Take 1 tablet (10 mg total) by mouth 3 (three) times daily as needed. .  Multiple Vitamins-Minerals (MULTIVITAMIN WITH MINERALS) tablet, Take 1 tablet by mouth daily. .  Probiotic Product (PROBIOTIC PO), Take 1 capsule by mouth daily. .  Vitamin D, Ergocalciferol, (DRISDOL) 1.25 MG (50000 UNIT) CAPS capsule, Take 1 capsule (50,000 Units total) by mouth every 7 (seven) days.   Reviewed prior external information including notes and imaging from  primary care provider As well as notes that were available from care everywhere and other healthcare systems.  Past medical history, social, surgical and family history all reviewed in electronic medical record.  No pertanent information unless stated regarding to the chief complaint.   Review of Systems:  No headache, visual changes, nausea, vomiting, diarrhea, constipation, dizziness, abdominal pain, skin rash, fevers, chills, night sweats, weight loss, swollen lymph nodes, body aches, joint swelling, chest pain, shortness of breath, mood changes. POSITIVE muscle aches  Objective  Blood pressure 134/84, pulse 99, height 5\' 5"  (1.651 m), weight 240 lb (108.9 kg), SpO2 98 %.   General: No apparent distress alert and oriented x3 mood and affect normal, dressed appropriately.  HEENT: Pupils equal, extraocular movements intact  Respiratory: Patient's speak in full sentences and does not appear short of breath  Cardiovascular: No lower extremity edema, non tender, no erythema  Neuro: Cranial nerves II through XII are intact, neurovascularly intact in all  extremities with 2+ DTRs and 2+ pulses.  Gait normal with good balance and coordination.  MSK: Tender to palpation out out of proportion to the amount of palpation.  Significant tightness in the lower extremities.  Unable to do Corky Sox secondary to patient body habitus.  Osteopathic findings  C6 flexed rotated and side bent left T3 extended rotated and side bent right inhaled third rib T6 extended rotated and side bent left T11 extended rotated and side bent right L2 flexed rotated and side bent left Sacrum right on right     Impression and Recommendations:     This case required medical decision making of moderate complexity. The above documentation has been reviewed and is accurate and complete Lyndal Pulley, DO       Note: This dictation was prepared with Dragon dictation along with smaller phrase technology. Any transcriptional  errors that result from this process are unintentional.

## 2020-02-10 NOTE — Assessment & Plan Note (Signed)
Responded very well though to osteopathic manipulation today even though this is a chronic problem with an exacerbation.  Patient has had more of a sinus infection and is on antibiotics at the moment.  We discussed which activities to do which wants to avoid.  Patient is to increase activity slowly.  Discussed icing regimen.  Follow-up with me again in 4 to 8 weeks for further evaluation and treatment.

## 2020-02-10 NOTE — Assessment & Plan Note (Signed)
Pulse ox is fine.  Patient is very concerned noted with shortness of breath.  D-dimer and CBC ordered today.  Continue the antibiotics as well as the inhaler and the prednisone.  Follow-up with primary care provider if worsening symptoms.

## 2020-02-11 LAB — D-DIMER, QUANTITATIVE: D-Dimer, Quant: 0.24 mcg/mL FEU (ref <0.50)

## 2020-02-11 LAB — SAR COV2 SEROLOGY (COVID19)AB(IGG),IA: DiaSorin SARS-CoV-2 Ab, IgG: NEGATIVE

## 2020-02-14 ENCOUNTER — Ambulatory Visit (INDEPENDENT_AMBULATORY_CARE_PROVIDER_SITE_OTHER): Payer: 59 | Admitting: Family Medicine

## 2020-02-14 ENCOUNTER — Encounter (INDEPENDENT_AMBULATORY_CARE_PROVIDER_SITE_OTHER): Payer: Self-pay | Admitting: Family Medicine

## 2020-02-14 ENCOUNTER — Other Ambulatory Visit: Payer: Self-pay

## 2020-02-14 VITALS — BP 112/75 | HR 87 | Temp 98.5°F | Ht 65.0 in | Wt 235.0 lb

## 2020-02-14 DIAGNOSIS — Z9189 Other specified personal risk factors, not elsewhere classified: Secondary | ICD-10-CM

## 2020-02-14 DIAGNOSIS — D509 Iron deficiency anemia, unspecified: Secondary | ICD-10-CM

## 2020-02-14 DIAGNOSIS — E559 Vitamin D deficiency, unspecified: Secondary | ICD-10-CM | POA: Diagnosis not present

## 2020-02-14 DIAGNOSIS — Z6839 Body mass index (BMI) 39.0-39.9, adult: Secondary | ICD-10-CM

## 2020-02-14 MED ORDER — VITAMIN D (ERGOCALCIFEROL) 1.25 MG (50000 UNIT) PO CAPS: 50000.0000 [IU] | ORAL_CAPSULE | ORAL | 0 refills | Status: AC

## 2020-02-14 MED FILL — VIT D2 1.25 MG (50,000 UNIT: 1.25 MG | 28 days supply | Qty: 4 | Fill #0

## 2020-02-16 NOTE — Progress Notes (Signed)
Chief Complaint:   OBESITY Katrina Larson is here to discuss her progress with her obesity treatment plan along with follow-up of her obesity related diagnoses. Katrina Larson is on the [employer] + 300 calories and states she is following her eating plan approximately 80% of the time. Katrina Larson states she is doing 0 minutes 0 times per week.  Today's visit was #: 5 Starting weight: 237 lbs Starting date: 11/15/2019 Today's weight: 235 lbs Today's date: 02/14/2020 Total lbs lost to date: 2 Total lbs lost since last in-office visit: 2  Interim History: Katrina Larson voices the last few weeks she has done ok, and she had a sinus infection and really hasn't felt well. She voices she was super stressed out. She did drink protein and ate broth.  Subjective:   1. Vitamin D deficiency Katrina Larson denies nausea, vomiting, or muscle weakness, but she notes fatigue. She is on prescription Vit D. Last Vit D level was 27.0.  2. Microcytic anemia Katrina Larson's MCV is now 79 and her hemoglobin and hematocrit is still low. She is on B12 and multivitamins.  3. At risk for osteoporosis Katrina Larson is at higher risk of osteopenia and osteoporosis due to Vitamin D deficiency.   Assessment/Plan:   1. Vitamin D deficiency Low Vitamin D level contributes to fatigue and are associated with obesity, breast, and colon cancer. We will refill prescription Vitamin D for 1 month. Katrina Larson will follow-up for routine testing of Vitamin D, at least 2-3 times per year to avoid over-replacement.  - Vitamin D, Ergocalciferol, (DRISDOL) 1.25 MG (50000 UNIT) CAPS capsule; Take 1 capsule (50,000 Units total) by mouth every 7 (seven) days.  Dispense: 4 capsule; Refill: 0  2. Microcytic anemia We will follow up on anemia panel in 2 months. Orders and follow up as documented in patient record.  Counseling . Iron is essential for our bodies to make red blood cells.  Reasons that someone may be deficient include: an iron-deficient diet  (more likely in those following vegan or vegetarian diets), women with heavy menses, patients with GI disorders or poor absorption, patients that have had bariatric surgery, frequent blood donors, patients with cancer, and patients with heart disease.   Katrina Larson foods include dark leafy greens, red and white meats, eggs, seafood, and beans.   . Certain foods and drinks prevent your body from absorbing iron properly. Avoid eating these foods in the same meal as iron-rich foods or with iron supplements. These foods include: coffee, black tea, and red wine; milk, dairy products, and foods that are high in calcium; beans and soybeans; whole grains.  . Constipation can be a side effect of iron supplementation. Increased water and fiber intake are helpful. Water goal: > 2 liters/day. Fiber goal: > 25 grams/day.  3. At risk for osteoporosis Katrina Larson was given approximately 15 minutes of osteoporosis prevention counseling today. Katrina Larson is at risk for osteopenia and osteoporosis due to her Vitamin D deficiency. She was encouraged to take her Vitamin D and follow her higher calcium diet and increase strengthening exercise to help strengthen her bones and decrease her risk of osteopenia and osteoporosis.  Repetitive spaced learning was employed today to elicit superior memory formation and behavioral change.  4. Class 2 severe obesity with serious comorbidity and body mass index (BMI) of 39.0 to 39.9 in adult, unspecified obesity type (HCC) Katrina Larson is currently in the action stage of change. As such, her goal is to continue with weight loss efforts. She has agreed to the [employer]  Plan + 300 calories.   Exercise goals: No exercise has been prescribed at this time.  Behavioral modification strategies: increasing lean protein intake, increasing vegetables, meal planning and cooking strategies and planning for success.  Katrina Larson has agreed to follow-up with our clinic in 2 weeks. She was informed of the  importance of frequent follow-up visits to maximize her success with intensive lifestyle modifications for her multiple health conditions.   Objective:   Blood pressure 112/75, pulse 87, temperature 98.5 F (36.9 C), temperature source Oral, height 5\' 5"  (1.651 m), weight 235 lb (106.6 kg), SpO2 98 %. Body mass index is 39.11 kg/m.  General: Cooperative, alert, well developed, in no acute distress. HEENT: Conjunctivae and lids unremarkable. Cardiovascular: Regular rhythm.  Lungs: Normal work of breathing. Neurologic: No focal deficits.   Lab Results  Component Value Date   CREATININE 0.80 02/10/2020   BUN 9 02/10/2020   NA 136 02/10/2020   K 3.9 02/10/2020   CL 102 02/10/2020   CO2 25 02/10/2020   Lab Results  Component Value Date   ALT 15 02/10/2020   AST 15 02/10/2020   ALKPHOS 104 02/10/2020   BILITOT 0.3 02/10/2020   Lab Results  Component Value Date   HGBA1C 6.1 11/04/2019   HGBA1C 6.1 01/02/2018   HGBA1C 6.0 08/14/2015   HGBA1C 6.0 02/09/2015   Lab Results  Component Value Date   INSULIN 17.6 11/15/2019   Lab Results  Component Value Date   TSH 1.97 11/04/2019   Lab Results  Component Value Date   CHOL 186 11/04/2019   HDL 43.50 11/04/2019   LDLCALC 119 (H) 11/04/2019   TRIG 113.0 11/04/2019   CHOLHDL 4 11/04/2019   Lab Results  Component Value Date   WBC 9.7 02/10/2020   HGB 11.3 (L) 02/10/2020   HCT 35.5 (L) 02/10/2020   MCV 79.0 02/10/2020   PLT 336.0 02/10/2020   Lab Results  Component Value Date   IRON 27 11/15/2019   TIBC 346 11/15/2019   FERRITIN 43 11/15/2019   Attestation Statements:   Reviewed by clinician on day of visit: allergies, medications, problem list, medical history, surgical history, family history, social history, and previous encounter notes.   I, Trixie Dredge, am acting as transcriptionist for April Manson, MD.  I have reviewed the above documentation for accuracy and completeness, and I agree with the  above. - Ilene Qua, MD

## 2020-03-02 ENCOUNTER — Encounter (INDEPENDENT_AMBULATORY_CARE_PROVIDER_SITE_OTHER): Payer: Self-pay

## 2020-03-02 ENCOUNTER — Ambulatory Visit (INDEPENDENT_AMBULATORY_CARE_PROVIDER_SITE_OTHER): Payer: 59 | Admitting: Family Medicine

## 2020-03-20 DIAGNOSIS — Z30431 Encounter for routine checking of intrauterine contraceptive device: Secondary | ICD-10-CM | POA: Diagnosis not present

## 2020-03-20 DIAGNOSIS — B372 Candidiasis of skin and nail: Secondary | ICD-10-CM | POA: Diagnosis not present

## 2020-03-20 MED FILL — FLUCONAZOLE 150 MG TABS: 150 | 2 days supply | Qty: 2 | Fill #0

## 2020-03-21 ENCOUNTER — Other Ambulatory Visit: Payer: Self-pay | Admitting: Obstetrics & Gynecology

## 2020-03-21 ENCOUNTER — Ambulatory Visit (INDEPENDENT_AMBULATORY_CARE_PROVIDER_SITE_OTHER): Payer: 59 | Admitting: Family Medicine

## 2020-03-21 ENCOUNTER — Encounter: Payer: Self-pay | Admitting: Family Medicine

## 2020-03-21 ENCOUNTER — Other Ambulatory Visit: Payer: Self-pay

## 2020-03-21 ENCOUNTER — Ambulatory Visit
Admission: RE | Admit: 2020-03-21 | Discharge: 2020-03-21 | Disposition: A | Discharge status: Home / Self Care | Payer: 59 | Source: Ambulatory Visit | Attending: Obstetrics & Gynecology | Admitting: Obstetrics & Gynecology

## 2020-03-21 VITALS — BP 130/80 | HR 97 | Ht 65.0 in | Wt 240.0 lb

## 2020-03-21 DIAGNOSIS — Z30431 Encounter for routine checking of intrauterine contraceptive device: Secondary | ICD-10-CM

## 2020-03-21 DIAGNOSIS — G8929 Other chronic pain: Secondary | ICD-10-CM

## 2020-03-21 DIAGNOSIS — M545 Low back pain, unspecified: Secondary | ICD-10-CM

## 2020-03-21 DIAGNOSIS — M999 Biomechanical lesion, unspecified: Secondary | ICD-10-CM | POA: Diagnosis not present

## 2020-03-21 DIAGNOSIS — Z0389 Encounter for observation for other suspected diseases and conditions ruled out: Secondary | ICD-10-CM | POA: Diagnosis not present

## 2020-03-21 NOTE — Progress Notes (Signed)
[facility] [address] [place] Phone: [phone] Subjective:   [name] am serving as a Education administrator for Dr. Hulan Saas.  This visit occurred during the SARS-CoV-2 public health emergency.  Safety protocols were in place, including screening questions prior to the visit, additional usage of staff PPE, and extensive cleaning of exam room while observing appropriate contact time as indicated for disinfecting solutions.   I'm seeing this patient by the request  of:  Hoyt Koch, MD  CC: Low back pain follow-up  [id number]  Katrina Larson is a 39 y.o. female coming in with complaint of back pain. Last seen on 02/10/2020 for OMT. Patient states she has been doing better. Less stressed.  Patient having some tenderness to palpation.  Patient states that is somewhat better than she was previously.  Is making progress she thinks overall.      Past Medical History:  Diagnosis Date  . Gallstones   . GERD (gastroesophageal reflux disease)   . H/O knee surgery   . Hepatic steatosis   . Obesity   . PCOS (polycystic ovarian syndrome)    Past Surgical History:  Procedure Laterality Date  . APPENDECTOMY    . CHOLECYSTECTOMY N/A 11/01/2016   Procedure: LAPAROSCOPIC CHOLECYSTECTOMY;  Surgeon: Clovis Riley, MD;  Location: [facility];  Service: General;  Laterality: N/A;  . COLONOSCOPY    . KNEE SURGERY Right   . TONSILLECTOMY     Social History   Socioeconomic History  . Marital status: Widowed    Spouse name: Not on file  . Number of children: Not on file  . Years of education: Not on file  . Highest education level: Not on file  Occupational History  . Not on file  Tobacco Use  . Smoking status: Never Smoker  . Smokeless tobacco: Never Used  Substance and Sexual Activity  . Alcohol use: Yes    Alcohol/week: 0.0 standard drinks    Comment: rare  . Drug use: No  . Sexual activity: Not on file  Other Topics  Concern  . Not on file  Social History Narrative  . Not on file   Social Determinants of Health   Financial Resource Strain:   . Difficulty of Paying Living Expenses:   Food Insecurity:   . Worried About Charity fundraiser in the Last Year:   . Arboriculturist in the Last Year:   Transportation Needs:   . Film/video editor (Medical):   Marland Kitchen Lack of Transportation (Non-Medical):   Physical Activity:   . Days of Exercise per Week:   . Minutes of Exercise per Session:   Stress:   . Feeling of Stress :   Social Connections:   . Frequency of Communication with Friends and Family:   . Frequency of Social Gatherings with Friends and Family:   . Attends Religious Services:   . Active Member of Clubs or Organizations:   . Attends Archivist Meetings:   Marland Kitchen Marital Status:    Allergies  Allergen Reactions  . Latex Hives and Rash   Family History  Problem Relation Age of Onset  . Thyroid disease Mother   . Sleep apnea Mother   . Obesity Mother   . Alcoholism Father   . Obesity Father   . Drug abuse Father   . Colon cancer Neg Hx   . Esophageal cancer Neg Hx   . Pancreatic cancer Neg Hx   .  Stomach cancer Neg Hx   . Liver disease Neg Hx     Current Outpatient Medications (Endocrine & Metabolic):  .  methylPREDNISolone (MEDROL DOSEPAK) 4 MG TBPK tablet, 24 mg PO on day 1, then decr. by 4 mg/day x5 days   Current Outpatient Medications (Respiratory):  .  albuterol (VENTOLIN HFA) 108 (90 Base) MCG/ACT inhaler, Inhale 2 puffs into the lungs every 6 (six) hours as needed for wheezing or shortness of breath. .  fluticasone (FLONASE) 50 MCG/ACT nasal spray, Place into both nostrils daily. Marland Kitchen  loratadine-pseudoephedrine (CLARITIN-D 24 HOUR) 10-240 MG 24 hr tablet, Take 1 tablet by mouth daily.   Current Outpatient Medications (Hematological):  .  vitamin B-12 (CYANOCOBALAMIN) 1000 MCG tablet, Take 1,000 mcg by mouth daily.  Current Outpatient Medications (Other):  .   amoxicillin-clavulanate (AUGMENTIN) 875-125 MG tablet, Take 1 tablet by mouth every 12 (twelve) hours. .  gabapentin (NEURONTIN) 100 MG capsule, Take 2 capsules (200 mg total) by mouth at bedtime. .  hydrOXYzine (ATARAX/VISTARIL) 10 MG tablet, Take 1 tablet (10 mg total) by mouth 3 (three) times daily as needed. .  Multiple Vitamins-Minerals (MULTIVITAMIN WITH MINERALS) tablet, Take 1 tablet by mouth daily. .  Probiotic Product (PROBIOTIC PO), Take 1 capsule by mouth daily. .  Vitamin D, Ergocalciferol, (DRISDOL) 1.25 MG (50000 UNIT) CAPS capsule, Take 1 capsule (50,000 Units total) by mouth every 7 (seven) days.   Reviewed prior external information including notes and imaging from  primary care provider As well as notes that were available from care everywhere and other healthcare systems.  Past medical history, social, surgical and family history all reviewed in electronic medical record.  No pertanent information unless stated regarding to the chief complaint.   Review of Systems:  No headache, visual changes, nausea, vomiting, diarrhea, constipation, dizziness, abdominal pain, skin rash, fevers, chills, night sweats, weight loss, swollen lymph nodes, body aches, joint swelling, chest pain, shortness of breath, mood changes. POSITIVE muscle aches  Objective  Blood pressure 130/80, pulse 97, height 5\' 5"  (1.651 m), weight 240 lb (108.9 kg), last menstrual period 03/14/2020, SpO2 98 %.   General: No apparent distress alert and oriented x3 mood and affect normal, dressed appropriately.  HEENT: Pupils equal, extraocular movements intact  Respiratory: Patient's speak in full sentences and does not appear short of breath  Cardiovascular: No lower extremity edema, non tender, no erythema  Neuro: Cranial nerves II through XII are intact, neurovascularly intact in all extremities with 2+ DTRs and 2+ pulses.  Gait normal with good balance and coordination.  MSK:   Back exam does show some mild  loss of lordosis.  Core strength is primarily poor.  Patient is tender to palpation in the sacroiliac joints bilaterally.  Tightness with FABER test bilaterally.  Osteopathic findings C2 flexed rotated and side bent right C6 flexed rotated and side bent left T6 extended rotated and side bent right inhaled rib T9 extended rotated and side bent left L3 flexed rotated and side bent right Sacrum right on right     Impression and Recommendations:     This case required medical decision making of moderate complexity. The above documentation has been reviewed and is accurate and complete Lyndal Pulley, DO       Note: This dictation was prepared with Dragon dictation along with smaller phrase technology. Any transcriptional errors that result from this process are unintentional.

## 2020-03-21 NOTE — Assessment & Plan Note (Signed)
Low back pain overall.  Discussed with patient in great length about icing regimen and home exercises, discussed which activities to doing which wants to avoid.  Patient is to increase activity slowly.  Patient will continue to increase use of hydroxyzine if needed.  Discussed the gabapentin.  Follow-up again in 4 to 8 weeks

## 2020-03-21 NOTE — Assessment & Plan Note (Signed)

## 2020-03-21 NOTE — Patient Instructions (Signed)
Much better than last visit See me again in 6 weeks

## 2020-05-08 ENCOUNTER — Ambulatory Visit: Payer: 59 | Admitting: Family Medicine

## 2020-05-08 NOTE — Progress Notes (Deleted)
  [place] Phone: [phone] Subjective:    I'm seeing this patient by the request  of:  Hoyt Koch, MD  CC:   [id number]  Katrina Larson is a 39 y.o. female coming in with complaint of back and neck pain. OMT 03/21/2020. Patient states   Medications patient has been prescribed:   Taking:         Reviewed prior external information including notes and imaging from previsou exam, outside providers and external EMR if available.   As well as notes that were available from care everywhere and other healthcare systems.  Past medical history, social, surgical and family history all reviewed in electronic medical record.  No pertanent information unless stated regarding to the chief complaint.   Past Medical History:  Diagnosis Date  . Gallstones   . GERD (gastroesophageal reflux disease)   . H/O knee surgery   . Hepatic steatosis   . Obesity   . PCOS (polycystic ovarian syndrome)     Allergies  Allergen Reactions  . Latex Hives and Rash     Review of Systems:  No headache, visual changes, nausea, vomiting, diarrhea, constipation, dizziness, abdominal pain, skin rash, fevers, chills, night sweats, weight loss, swollen lymph nodes, body aches, joint swelling, chest pain, shortness of breath, mood changes. POSITIVE muscle aches  Objective  There were no vitals taken for this visit.   General: No apparent distress alert and oriented x3 mood and affect normal, dressed appropriately.  HEENT: Pupils equal, extraocular movements intact  Respiratory: Patient's speak in full sentences and does not appear short of breath  Cardiovascular: No lower extremity edema, non tender, no erythema  Neuro: Cranial nerves II through XII are intact, neurovascularly intact in all extremities with 2+ DTRs and 2+ pulses.  Gait normal with good balance and coordination.  MSK:  Non tender with full range  of motion and good stability and symmetric strength and tone of shoulders, elbows, wrist, hip, knee and ankles bilaterally.  Back - Normal skin, Spine with normal alignment and no deformity.  No tenderness to vertebral process palpation.  Paraspinous muscles are not tender and without spasm.   Range of motion is full at neck and lumbar sacral regions  Osteopathic findings  C2 flexed rotated and side bent right C6 flexed rotated and side bent left T3 extended rotated and side bent right inhaled rib T9 extended rotated and side bent left L2 flexed rotated and side bent right Sacrum right on right       Assessment and Plan:    Nonallopathic problems  Decision today to treat with OMT was based on Physical Exam  After verbal consent patient was treated with HVLA, ME, FPR techniques in cervical, rib, thoracic, lumbar, and sacral  areas  Patient tolerated the procedure well with improvement in symptoms  Patient given exercises, stretches and lifestyle modifications  See medications in patient instructions if given  Patient will follow up in 4-8 weeks      The above documentation has been reviewed and is accurate and complete Katrina Larson       Note: This dictation was prepared with Dragon dictation along with smaller phrase technology. Any transcriptional errors that result from this process are unintentional.

## 2021-12-25 ENCOUNTER — Encounter: Payer: Self-pay | Admitting: Gastroenterology

## 2022-06-05 ENCOUNTER — Encounter (INDEPENDENT_AMBULATORY_CARE_PROVIDER_SITE_OTHER): Payer: Self-pay
